# Patient Record
Sex: Female | Born: 1955 | Race: White | Hispanic: No
Health system: Western US, Academic
[De-identification: ages and names within clinical notes are randomized; demographics above are authoritative.]

## PROBLEM LIST (undated history)

## (undated) DIAGNOSIS — I10 Essential (primary) hypertension: Secondary | ICD-10-CM

## (undated) DIAGNOSIS — M629 Disorder of muscle, unspecified: Secondary | ICD-10-CM

## (undated) DIAGNOSIS — I4891 Unspecified atrial fibrillation: Secondary | ICD-10-CM

## (undated) DIAGNOSIS — M259 Joint disorder, unspecified: Secondary | ICD-10-CM

## (undated) HISTORY — DX: Joint disorder, unspecified: M25.9

## (undated) HISTORY — PX: ABDOMINOPLASTY: SHX5160

## (undated) HISTORY — PX: PR UNLISTED PROCEDURE NECK/THORAX: 21899

## (undated) HISTORY — PX: BACK SURGERY: SHX5067

## (undated) HISTORY — DX: Unspecified atrial fibrillation: I48.91

## (undated) HISTORY — PX: PR MAMMAPLASTY AUGMENTATION W/O PROSTHETIC IMPLANT: 19324

## (undated) HISTORY — PX: CARPAL TUNNEL RELEASE: SHX5017

## (undated) HISTORY — DX: Essential (primary) hypertension: I10

## (undated) HISTORY — DX: Disorder of muscle, unspecified: M62.9

## (undated) HISTORY — PX: PR UNLISTED PROCEDURE FOOT/TOES: 28899

## (undated) HISTORY — PX: PR UNLISTED PROCEDURE SPINE: 22899

---

## 2016-02-14 ENCOUNTER — Encounter (HOSPITAL_BASED_OUTPATIENT_CLINIC_OR_DEPARTMENT_OTHER): Payer: Self-pay

## 2016-02-14 NOTE — Telephone Encounter (Signed)
Entered in error

## 2016-03-03 ENCOUNTER — Encounter (HOSPITAL_BASED_OUTPATIENT_CLINIC_OR_DEPARTMENT_OTHER): Payer: Self-pay

## 2016-03-28 ENCOUNTER — Telehealth (HOSPITAL_BASED_OUTPATIENT_CLINIC_OR_DEPARTMENT_OTHER): Payer: Self-pay | Admitting: Physician Assistant

## 2016-03-28 NOTE — Telephone Encounter (Signed)
Pt returned Trinna PostAlex B phone call.  Inetta Fermoina 6/20

## 2016-04-06 NOTE — Telephone Encounter (Signed)
I talked to Ms. Early CharsDill and she would like to schedule for Right ream and run in January.            Hansel FeinsteinAlexander Bertelsen, PA-C  Teaching Associate  Dept of Orthopaedics and Sports Medicine  Shoulder and Elbow Team   [employer]

## 2016-04-14 NOTE — Telephone Encounter (Signed)
Left message asking which week in January.   Advised that I would like to talk to her at length to give her all the information she needs.    Sandie AnoMarian Forssen  [facility]

## 2018-07-26 ENCOUNTER — Encounter (HOSPITAL_BASED_OUTPATIENT_CLINIC_OR_DEPARTMENT_OTHER): Payer: Self-pay | Admitting: Orthopaedic Surgery

## 2018-08-07 ENCOUNTER — Encounter (HOSPITAL_BASED_OUTPATIENT_CLINIC_OR_DEPARTMENT_OTHER): Payer: Self-pay

## 2018-08-13 ENCOUNTER — Encounter (HOSPITAL_BASED_OUTPATIENT_CLINIC_OR_DEPARTMENT_OTHER): Payer: Self-pay

## 2018-08-15 ENCOUNTER — Encounter (HOSPITAL_BASED_OUTPATIENT_CLINIC_OR_DEPARTMENT_OTHER): Payer: Self-pay | Admitting: Orthopaedic Surgery

## 2018-08-21 ENCOUNTER — Telehealth (HOSPITAL_COMMUNITY): Payer: Self-pay

## 2018-08-21 NOTE — Telephone Encounter (Signed)
Updated med list per phone interview with patient for Med Consult [facility]

## 2018-08-25 DIAGNOSIS — Z9889 Other specified postprocedural states: Secondary | ICD-10-CM | POA: Insufficient documentation

## 2018-08-25 DIAGNOSIS — Z955 Presence of coronary angioplasty implant and graft: Secondary | ICD-10-CM | POA: Insufficient documentation

## 2018-08-25 DIAGNOSIS — I2510 Atherosclerotic heart disease of native coronary artery without angina pectoris: Secondary | ICD-10-CM | POA: Insufficient documentation

## 2018-08-25 DIAGNOSIS — I10 Essential (primary) hypertension: Secondary | ICD-10-CM | POA: Insufficient documentation

## 2018-08-25 DIAGNOSIS — Z8679 Personal history of other diseases of the circulatory system: Secondary | ICD-10-CM | POA: Insufficient documentation

## 2018-08-26 ENCOUNTER — Ambulatory Visit (HOSPITAL_BASED_OUTPATIENT_CLINIC_OR_DEPARTMENT_OTHER): Payer: BLUE CROSS/BLUE SHIELD | Admitting: Orthopaedic Surgery

## 2018-08-26 ENCOUNTER — Encounter (HOSPITAL_BASED_OUTPATIENT_CLINIC_OR_DEPARTMENT_OTHER): Payer: Self-pay | Admitting: Orthopaedic Surgery

## 2018-08-26 ENCOUNTER — Encounter (HOSPITAL_BASED_OUTPATIENT_CLINIC_OR_DEPARTMENT_OTHER): Payer: BLUE CROSS/BLUE SHIELD | Admitting: Orthopaedic Surgery

## 2018-08-26 ENCOUNTER — Encounter (HOSPITAL_BASED_OUTPATIENT_CLINIC_OR_DEPARTMENT_OTHER): Payer: Self-pay | Admitting: Unknown Physician Specialty

## 2018-08-26 ENCOUNTER — Ambulatory Visit (HOSPITAL_BASED_OUTPATIENT_CLINIC_OR_DEPARTMENT_OTHER): Payer: BLUE CROSS/BLUE SHIELD | Admitting: Unknown Physician Specialty

## 2018-08-26 ENCOUNTER — Ambulatory Visit (HOSPITAL_BASED_OUTPATIENT_CLINIC_OR_DEPARTMENT_OTHER): Payer: BLUE CROSS/BLUE SHIELD

## 2018-08-26 VITALS — BP 131/91 | HR 84 | Temp 98.4°F | Ht 61.0 in | Wt 133.5 lb

## 2018-08-26 DIAGNOSIS — Z955 Presence of coronary angioplasty implant and graft: Secondary | ICD-10-CM

## 2018-08-26 DIAGNOSIS — M25519 Pain in unspecified shoulder: Secondary | ICD-10-CM

## 2018-08-26 DIAGNOSIS — I10 Essential (primary) hypertension: Secondary | ICD-10-CM

## 2018-08-26 DIAGNOSIS — Z01818 Encounter for other preprocedural examination: Secondary | ICD-10-CM

## 2018-08-26 DIAGNOSIS — Z6825 Body mass index (BMI) 25.0-25.9, adult: Secondary | ICD-10-CM

## 2018-08-26 DIAGNOSIS — I2510 Atherosclerotic heart disease of native coronary artery without angina pectoris: Secondary | ICD-10-CM

## 2018-08-26 DIAGNOSIS — Z9889 Other specified postprocedural states: Secondary | ICD-10-CM

## 2018-08-26 DIAGNOSIS — M19011 Primary osteoarthritis, right shoulder: Secondary | ICD-10-CM

## 2018-08-26 DIAGNOSIS — Z8679 Personal history of other diseases of the circulatory system: Secondary | ICD-10-CM

## 2018-08-26 LAB — CBC, DIFF
% Basophils: 1 %
% Eosinophils: 2 %
% Immature Granulocytes: 0 %
% Lymphocytes: 31 %
% Monocytes: 14 %
% Neutrophils: 52 %
% Nucleated RBC: 0 %
Absolute Eosinophil Count: 0.08 10*3/uL (ref 0.00–0.50)
Absolute Lymphocyte Count: 1.67 10*3/uL (ref 1.00–4.80)
Basophils: 0.04 10*3/uL (ref 0.00–0.20)
Hematocrit: 40 % (ref 36–45)
Hemoglobin: 13.7 g/dL (ref 11.5–15.5)
Immature Granulocytes: 0.01 10*3/uL (ref 0.00–0.05)
MCH: 34.2 pg — ABNORMAL HIGH (ref 27.3–33.6)
MCHC: 34.3 g/dL (ref 32.2–36.5)
MCV: 100 fL — ABNORMAL HIGH (ref 81–98)
Monocytes: 0.76 10*3/uL (ref 0.00–0.80)
Neutrophils: 2.78 10*3/uL (ref 1.80–7.00)
Nucleated RBC: 0 10*3/uL
Platelet Count: 335 10*3/uL (ref 150–400)
RBC: 4.01 10*6/uL (ref 3.80–5.00)
RDW-CV: 11 % — ABNORMAL LOW (ref 11.6–14.4)
WBC: 5.34 10*3/uL (ref 4.3–10.0)

## 2018-08-26 LAB — COMPREHENSIVE METABOLIC PANEL
ALT (GPT): 25 U/L (ref 7–33)
AST (GOT): 34 U/L (ref 9–38)
Albumin: 4.7 g/dL (ref 3.5–5.2)
Alkaline Phosphatase (Total): 101 U/L (ref 31–132)
Anion Gap: 11 (ref 4–12)
Bilirubin (Total): 0.7 mg/dL (ref 0.2–1.3)
Calcium: 9.6 mg/dL (ref 8.9–10.2)
Carbon Dioxide, Total: 29 meq/L (ref 22–32)
Chloride: 98 meq/L (ref 98–108)
Creatinine: 0.65 mg/dL (ref 0.38–1.02)
GFR, Calc, African American: >60 mL/min/{1.73_m2} (ref >59)
GFR, Calc, European American: >60 mL/min/{1.73_m2} (ref >59)
Glucose: 95 mg/dL (ref 62–125)
Potassium: 4 meq/L (ref 3.6–5.2)
Protein (Total): 7 g/dL (ref 6.0–8.2)
Sodium: 138 meq/L (ref 135–145)
Urea Nitrogen: 12 mg/dL (ref 8–21)

## 2018-08-26 LAB — PROTHROMBIN & PTT
Partial Thromboplastin Time: 31 s (ref 22–35)
Prothrombin INR: 1 (ref 0.8–1.3)
Prothrombin Time Patient: 13.2 s (ref 10.7–15.6)

## 2018-08-26 NOTE — Progress Notes (Signed)
Preoperative instruction given per [facility] protocol. Literature for [employer], [employer] Directive, and post-op instructions given to pt.  Discussed DOS and sequence of care/events.  Stressed NPO status preop, shower X2 prior to surgery with antibacterial soap. Pt advised to stop/hold the following meds:  NSAIDs have been held for 1 week pre-op.  Call if further questions/concerns. [facility] have initial post-op there and RTC for 6 week post-op. Pt will have pain med refills through [place] in [place].

## 2018-08-26 NOTE — Progress Notes (Signed)
[facility] Medicine Consult Service Clinic  Outpatient New Clinic Note    DATE: 08/26/2018     Requesting Provider: Barbette Or III  Planned Procedure/Date: R ream and run arthroplasty 08/27/18  PCP: Adria Symonds [facility]    Chief Complaint: pre-op evaluation prior to R ream and run arthroplasty    SUBJECTIVE:       HPI:  Desiree Jones is a 62 year old female who is here today for preoperative risk stratification and medical recommendations in anticipation of procedure listed above.     Desiree Jones has been experiencing pain and decreased ROM in the R shoulder for more than 5 years. She initially made an appointment here 2 years ago to receive a ream and run shoulder surgery but had second thoughts and cancelled at the time. Since then she has had progressive loss of function in her R shoulder and has reached the point where she would like surgery. Her husband had the same surgery in 2016 and they liked the outcomes so want the same procedure for her. 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 She denied history of trauma to the shoulder but says she has had chronic wear and tear on her shoulder from working out and weight lifting over the past 40 years. She takes Celebrex for the shoulder pain but has been holding it in anticipation of the surgery. She was prescribed Norco 7.5 mg for her shoulder pain, of which she has only taken 2-3 times since it was prescribed in September.     PMHx:    #CAD   At the time of cardiac work-up for a-flutter (see below), she received a cardiac CT that showed coronary findings concerning for CAD. She underwent LHC which showed 90% stenosis of her LAD and received a DES. She was on DAPT for 6 months after that. She still takes aspirin 81 mg daily but has been holding it in advance of surgery tomorrow. She was trialed on a statin but was unable to tolerate it  due to musculoskeletal pain so the statin was taken off and her lipids have been monitored. She was previously taking metoprolol for her a-fib but stopped it after her ablation. She is not currently taking a beta-blocker or an ACE inhibitor/ARB. She has never had chest pain or chest pressure. She denies PND, orthopnea, and peripheral edema.    #Afib/Aflutter  She was diagnosed with a-flutter in 2016, diagnosis confirmed with an EP study. At the time she was having episodes that would last 1 day long every week. She underwent cardiac mapping and cryoablation that year. Six months later she had recurring palpitations and underwent second cryoablation in 2017. Now she continues to have paroxysmal a-fib, 1 episodes every other month or so, lasts about a couple hours. These episodes are most commonly triggered by dehydration. Prior to the ablations she had a couple episodes of RVR and was briefly on dofetilide, but has not been in RVR since the procedures. She has not had any event monitoring since her second ablation. She is not currently on any anticoagulation. CHADS2VASC 3, stroke risk 4.6%.     #HTN  She initially was diagnosed with hypertension when undergoing cardiac clearance for lumbar spine surgery in 2014. She was started on amlodipine 10 mg then and has since been downtitrated to 5 mg daily for hypertension. BP runs 115-130/60-80  mm Hg at home. She denies any prior history of hypertensive urgency/emergency. She denies vision changes or headaches related to hypertension. She denies diagnosis of chronic kidney disease.     #Atopic Dermatitis  She has had eczema for >5 years, for which she takes dupilumab injections and fluocinonide cream. She was tested for TB and hepatitis serologies prior to starting dupilumab.     #Chronic Back Pain  She has had cervical radiculopathy which was treated with posterior cervical spinal fusion C4-C6. She had a lumbar fusion of L4-L5 in 2014 for spondylosthesis. She did her own PT  for her back pain.     PSHx:  LEEP and concomitant BTL 1985  Breast augmentation 2001  Cervical fusion 2004  Bilateral carpal tunnel surgery 2007  Lumbar fusion 2014  OD cataract surgery 2014  LEEP and cervical conization 2016  Afib ablation 2016, 2017  Coronary artery stent placement 2017    Surgical Complications:  Anesthesia complications: she denies any prior issues with tolerating anesthesia, intra-operative hypotension, post-operative bleeding, post-operative DVTs, post-anesthesia nausea, or post-operative delirium.    Medications:  Amlodipine 5 mg every day at night.  Aspirin 81 mg every day at night.  Celebrex 200 mg every day after breakfast.  Dupixent 300 mg injection every other Sunday. She last injected 11/10.  Fluocinonide cream 0.05% every day to areas of rash.    Allergies:  Latex sensitivity    Family Hx:  She doesn't know her family history because she was adopted.    Social Hx:  Tobacco: Never smoker.  ETOH: 4-6 shots of liquor a week.  Recreational drugs: denies    The patient lives in [place], [place] in a house. She and her husband recently moved from [place], [place].  The patient is not currently working. 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    Functional Capacity / Exercise Tolerance:  Able to walk more than 5 city blocks on flat surface.   Able to climb more than 5 flights of stairs without stopping.   Exercise tolerance is not limited by dyspnea or chest pain. She has created an exercise routine around her shoulder pain so MSK pain does not limit her activity level.    OSA Screen:  STOP-BANG Questionnaire for Obstructive Sleep Apnea  Assign 1-point for "YES" answers    Do you snore loudly? No  Do you often feel tired, fatigued, or sleepy during the daytime? No  Has anyone observed you stop breathing during sleep? No  Do you have (or are you being treated) for high blood pressure? Yes    BMI >35 kg/m^2 ?  No  Age >50? Yes  Neck circumference >40cm? No  Female gender? No    TOTAL: 2  Risk for OSA (High >= 3): intermediate      ROS:  The ROS other than noted above in "History of Present Illness" or "Problem List", or other than as noted here or on the aforementioned scanned form, was negative in all other systems.     Constitutional: negative  HEENT: negative  CV: negative  PULM: negative  GI: negative  GU: negative  Hem: negative  Endocrine: negative  Neuro: negative  Psych: negative  Skin: negative    OBJECTIVE:       Physical Exam:  BP (!) 131/91   Pulse 84    Temp 98.4 F (36.9 C) (Temporal)  Ht 5\' 1"  (1.549 m)    Wt 133 lb 8 oz (60.6 kg)    SpO2 99%    BMI 25.22 kg/m   GEN: NAD, well-appearing, appears stated age  HEENT: No conjunctival injection or pallor. Moist oral mucosa, no erythema or exudates  CV: RRR. S1, S2. No S3, S4. No murmur.    PULM: Breathing comfortably on room air. Lungs clear to auscultation bilaterally. No w/r/c.   ABD: Pos BS. Soft, NT, ND.    MSK: No gross deformity   NEURO: CN 2-12 intact. AAO x4.   DERM: No rash. Chronic solar dermatitis.   PSYCH: Appropriate affect. Alert, oriented x4     Pertinent Studies:   EKG obtained in clinic and reviewed by Dr. Daria PasturesSalud and myself. Normal sinus rhythm. T wave inversions in V1-V3 of unknown chronicity. No ST changes, no LVH.    Recent pertinent labs/images [place] by [place] [facility] [place].      ASSESSMENT & PLAN:     Desiree Jones is a 62 year old female presented today for preoperative risk stratification and medical recommendations in anticipation of above surgery.    Recommendations:  I. Cardiopulmonary Risk Assessment  Cardiac risk:   RCRI = 1  The patient's predicted risk of perioperative cardiac events using the Revised Cardiac Risk Index is approximately 6%.       Chales AbrahamsGupta = 0.1%  Newer, partly validated prediction tools (see Nolon NationsGupta et al, from Circulation. 2011 jul 26; 124(4):381-7) based upon ASA class, functional status, age, Cr, and procedure  type suggests 0.14% risk of major cardiac events.    The patient is able to achieve more than 4 METS of activity on regular basis without symptoms suggestive of cardiac ischemia.     Based upon this assessment, and on perioperative risk stratification guidelines from AHA/ACC, we would recommend proceeding with the planned surgery without additional cardiac work-up beyond the EKG obtained in clinic today. Though she has a significant history of coronary artery disease her EKG is reassuring and she has had no symptoms since her stent was placed 2 years ago. Regarding her atrial fibrillation, she has paroxysmal a-fib with relatively low frequency of arrhythmia (albeit without formal event monitoring). Her husband does note that dehydration is a significant trigger for converting to a-fib and we would recommend maintaining euvolemia with fluid resuscitation to prevent this.   There are scanned records in Epic (see Media tab) from [place] in [place] that recommend she proceed w/ surgery without further testing.   Of note, she is not taking typical secondary prevention medications for her coronary artery disease, including a statin, beta-blocker, or ACE inhibitor. While this is not a barrier to receiving surgery tomorrow, we did encourage the patient to discuss this with her primary care physician and cardiologist back in [place].     Pulmonary risk:   ARISCAT = 3  STOP-BANG = 2  The patient's predicted risk of perioperative pulmonary events is approximately 1.6%.    Based upon this assessment, we would recommend regular perioperative procedures. We do not anticipate difficult intubation or extubation. Though her STOP-BANG is technically "intermediate risk," she scores for non-modifiable risk factors and does not have any underlying pulmonary disease that confer greater pulmonary risk.     Delirium risk:   average based on history, risk factors, and medications.      II. Problem List:  1. History of CAD  2. Status post  coronary artery stent  3. Status post ablation  of atrial fibrillation  4. HTN  5. Atopic dermatitis  6. Chronic back pain    III. Specific Perioperative Considerations:  1. HOLD 7 days prior to surgery: celebrex and aspirin  2. Continue perioperatively: amlodipine  3. She may resume dupilumab injections per her normal schedule on 09/01/18 (when her next injection is due)  4. While the patient is admitted overnight please maintain her on telemetry to capture any rhythm conversion to a-fib and RVR.   5. Maintain euvolemia with adequate fluid resuscitation. The patient has no history of heart failure related to CAD and should have no contraindications to fluid resuscitation. Euvolemia is important in this patient prevent provoking a-fib due to dehydration.  6. Upon discharge encourage patient to follow up with her PCP about secondary prevention medications for CAD including beta-blocker, statin, and ACE inhibitor/ARB.    IV. General Perioperative Considerations:   Early mobilization as tolerated   Aggressive pulmonary toilet including incentive spirometry multiple times per hour while awake.    DVT/VTE prophylaxis per surgical team preference.    Treat postoperative pain as appropriate.    Minimize medications with psychoactive effects, such as benzodiazepines (e.g. Ativan), anti-dopaminergics (e.g. Reglan), anti-cholinergics (e.g. Benadryl).     Other delirium precautions include: re-orient frequently as needed; maintain day/night cycle including minimizing naps if possible; have family/friends at bedside as much as possible; mobilize early; keep sensory aids such as glasses or hearing aids nearby; avoid volume depletion and eletrolyte abnormalities, etc.     We thank Dr. Barbette Or III for this referral to our medicine consultation clinic. Please contact Dr. Daria Pastures with questions by [facility] email, EPIC inbox, or pager [phone].     To the inpatient surgical team: We will follow this patient post operatively. Please feel  free to contact us during admission through [facility] in [place].    Reece Agar, MD, MPH  Internal Medicine R1    ATTENDING STATEMENT  I saw and evaluated the patient and agree with Dr.Ganguly's note. I reviewed the note and edited where appropriate.    Date of Service:  08/26/18  Today's Date is: 08/27/18    Jonne Ply, MD MPH  Clinical Instructor, Division of General Internal Medicine

## 2018-08-26 NOTE — Progress Notes (Signed)
[facility] & [facility] Medicine  Shoulder And Elbow Service       [facility]; [address] [place] ; [place], [place]  [id number]  [phone]; [phone]    [email or web address]    Primary Care Provider:  Outside PCP  Identifies Patients Who Have A Non [facility] Pcp    Referring Provider:  No ref. provider found              Patient Care Team:  No providers found        Subjective History  We had the pleasure of seeing Ms. Desiree Jones in our Shoulder and Elbow Clinic at the [facility] for a clinical visit.  She is a most delightful 62 year old female with right shoulder arthritis.    Desiree Jones is know to us through her husband who did very well after a right shoulder ream and run.  Desiree Jones has advanced degenerative joint disease and would like to proceed with a right shoulder ream and run.        Related Information   Chief Complaint   Patient presents with    New Patient     right shoulder pain and decreased funtion and ROM   Handed: right handed     Work Related Problem: No    Is a lawyer involved with this problem: No     History of Present Illness  1. Location - where is the problem located? Right Shoulder    2. Severity - Intensity of Pain/discomfort: (1 = No Pain, 10 = Severe Pain): 6     3. Context - How did this problem begin? Refer to subjective note above.     4. Modifying Factors -  What makes symptom(s) worse? Using affected side    What improves your symptom(s)? Rest  Ice  Heat  NSAIDs       Review of Systems  Constitutional: negative for weight gain, weight loss, fatigue, insomnia, fever and night-sweats/chills   Eyes: glasses/contacts and cataracts    Ear/Nose/Throat: negative for sinus trouble, hearing loss and ringing in ears   Cardiovascular: high blood pressure and irregular heartbeat   Respiratory: negative for shortness of breath, difficulty breathing, lung disease and  persistent cough   Gastrointestinal: negative for decreased appetite, constipation, heartburn, nausea, diarrhea and hepatitis   Musculoskeletal:  arthritis   Genitourinary: negative for kidney stone, bladder/kidney infections, prostate problems and painful urinating   Skin/Integument:  dermatitis   Neurological:  negative for seizures, tingling, numbness and severe headaches   Psychiatric:  negative for anxiety, depression or other mental health issues   Endocrine:  negative for increased thirst, diabetes and thyroid disorders   Blood/Lymphatic: negative for bleeding or clotting problems, anemia and swollen or enlarged lymph nodes   Immunological: negative for HIV/AIDS, Sjgren's syndrome, scleroderma, hay fever and lupus   Cancer: negative for cancer        Other History  Her health history includes:     Social History     Occupational History    Not on file   Tobacco Use    Smoking status: Never Smoker   Substance and Sexual Activity    Alcohol use: Yes     Alcohol/week: 1.0 standard drinks     Types: 1 Shots of liquor per week    Drug use: Not on file    Sexual activity: Not on file   Marital  Status: Married   Number of adults living with Desiree Jones: 1        Past Medical History:   Diagnosis Date    Anesthesia complication     Disorder of muscle, ligament, and fascia     Hypertension     Shoulder problem      Past Surgical History:   Procedure Laterality Date    BACK SURGERY      CARPAL TUNNEL RELEASE      UNLISTED PROCEDURE FOOT/TOES      UNLISTED PROCEDURE NECK/THORAX      UNLISTED PROCEDURE SPINE     Review of patient's allergies indicates:  Allergies   Allergen Reactions    Latex Rash     Itchy rash     Current Outpatient Medications   Medication Sig Dispense Refill    amLODIPine 5 MG tablet Take 5 mg by mouth daily.      aspirin 81 MG tablet Take 81 mg by mouth daily.      celecoxib 200 MG capsule Take 200 mg by mouth daily.      dupilumab (DUPIXENT) 300 MG/2ML prefilled syringe Inject 300  mg under the skin. Every other week      fluocinonide 0.05 % cream 1 application. as needed      HYDROcodone-acetaminophen (NORCO) 7.5-325 MG tablet Take 1 tablet by mouth. as needed      ketoconazole 2 % shampoo 1 application. as needed       No current facility-administered medications for this visit.      Family History     *Patient is Adopted           Physical Examination  Please note that a for the findings below:       "-" signifies a Negative finding       "+" signifies a Positive finding       a blank denotes test was not performed or documented    Constitutional:  General appearance:  Desiree Jones is a well msucular, well nourished female in no apparent distress.   BP (!) 131/91 Comment: pt has list from cardiology, BP always high at offices but fine at home, takes dail   Pulse 84    Temp 98.4 F (36.9 C) (Temporal)    Ht 5\' 1"  (1.549 m)    Wt 133 lb 8 oz (60.6 kg)    SpO2 99%    BMI 25.22 kg/m     Psychological:  Her judgment, insight, memory, mood and affect appear to be within normal limits    Neurological:  She is alert and oriented without any obvious gross neurological deficits    Respiratory:  She is without any obvious respiratory distress    Upper Extremities:   Cardiovascular Inspection: Right    Edema Negative         Respiratory: Right    Cyanosis Negative         Hematologic: Right    Ecchymosis Negative         Skin Inspection: Right    Rashes  Negative    Lesions Negative    Ulcers Negative    Erythema Negative    Signs of infection Negative         Musculoskeletal: Right    Active forward elevation of the right arm with limited range of motion        Diagnostic Tests and Studies   X-ray Studies:  X-rays of the shoulder do not demonstrate any grossly obvious  fractures or dislocations.    The glenohumeral joint space appears to be decreased.    There is osteophyte formation noted in the proximity of the humeral head.    Other Imaging Studies:  Other imaging studies were not obtained or  available for review today.    Laboratory:  None       Assessment  and Plan   In summary, Desiree Jones is a 62 year old female with right shoulder discomfort and dysfunction, which appears to be caused by severe degenerative joint disease.     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  She is a candidate for a ream and run.  Because some of our patients wish to avoid the possible risk wear or loosening of the artificial socket (glenoid component), we have developed a procedure we refer to as the ream and run operation. In this surgery, the incision, surgical approach, and management of the ball side of the joint (the humeral articular surface) are exactly the same as for a total shoulder. However, the management of the socket side (glenoid) is different in that the bone is reamed to a concave shape designed to restore stability and load transfer, but a plastic socket is not inserted. Such a procedure may be desirable especially for individuals who are young or those who plan heavy use of the shoulder, particularly if that use involves repeated impact.     This is not a procedure for individuals who are smokers, who have complex shoulder conditions, who have pain requiring narcotic medication before surgery, or who are unable to commit to the time and effort for the aftercare following the procedure.     In the ream and run procedure the bony socket is reamed and not resurfaced, so the body has to heal over the surface with a layer of fibrous/cartilaginous tissue. During this healing time it is absolutely essential that the range of motion be maintained at the level achieved in the medical center,that is over 150 degrees of elevation (this is equivalent to getting the arm up to the level of the ear).  This requires special commitment on the part of the patient to make sure that stiffness does not creep into the shoulder. The required range of motion is maintained by gentle  stretching exercises that are performed five times a day for the first three months.     With a dedicated rehabilitation effort on the part of the patient the healing process is usually complete within six months, but in some cases it may take longer before the shoulder becomes comfortable.  Many of our patients have achieved wonderful results after this procedure and have regained superb range of motion, comfort, strength and function. Some of these individuals have offered to be resources to those considering this procedure or who are in the earlier stages of their rehabilitation.  In some cases, however, the patient is unable to maintain the range of motion or the shoulder never becomes comfortable. In these situations, consideration can be give to a revision shoulder surgery, either to release adhesions and scar tissue that may have formed, or sometimes to convert the ream and run to a total shoulder with an artificial glenoid socket.  In situations were patients become stiff during the recovery phase shortly after surgery, we can often manipulate their stiff shoulders while the patient is under anesthesia with good results.  More information on the Ream and Run can be found at [email or web address]  Desiree Jones would like to proceed with surgery.    Please note that Desiree Jones was seen by Dr. Karmen Bongo III who saw and examined this patient and agrees with this plan.           Wesley Blas, PA-C  [facility]   [facility] [place]  Shoulder and Elbow Team            Preop Note    We had the most wonderful pleasure of seeing Desiree Jones in our Shoulder and Elbow Clinic at the [facility] for a preoperative visit.      The postoperative Support Plan calls assistance from:  Telephone Information:   Home Phone [phone]   Work Phone Not on file.   Mobile [phone]        Accompany home after surgery: Desiree Jones, Desiree Jones Legal Next of Kin [phone]       Contact Number:    Relationship:        Assisting at home after surgery: same   Contact Number:    Relationship:               Health History and Physical Examination    Please refer to the Medicine Consult note for Desiree Jones detailed medical history, physical exam and medical recommendations.        Assessment and Plan   Desiree Jones is here for a pre-procedure evaluation for a right "Ream and Run" shoulder hemiarthroplasty with non-prosthetic glenoid arthroplasty.  She was explained the risks of proceeding with this procedure.  These risks include:  1. Infection   2. Ongoing pain   3. Ongoing bleeding  4. Stiffness   5. The risk that this procedure has no benefit  6. The risk of future surgeries  7. Peri-procedure fractures  8. Nerve damage  9. Blood clots  10. Chipping of teeth  11. End organ damage  12. Allergic reactions  13. Death or coma  14. Infectious disease transmission risk of receiving blood or blood products      She understands the risks and potential benefits, proposed procedure and is ready for this procedure.    Please note that Desiree Jones will undergo a Medicine Consult prior to her procedure.       Questions and/or concerns should be directed to either myself or our attending surgeon Reesa Chew, MD.               Wesley Blas, PA-C  Orthopaedics and Sports Medicine  [facility]  Shoulder and Elbow Team

## 2018-08-26 NOTE — Patient Instructions (Signed)
[facility]  [facility], 3rd floor  [phone]  [facility]  [address]   [place], [place] [id number]    Phone [phone]  Fax [phone]    We appreciate you spending time at our clinic today so that we could thoroughly learn your medical history and provide recommendations to minimize the risk of complications during your hospital stay. Please review this document closely as a reminder of what was discussed at today's clinic visit.     You saw the following physician at your visit today:    Everett GraffJonathan Salud, MD MPH    [facility] MED[facility] CON[facility]ULT VISIT  Your medical history and how it affects your surgery was discussed with you today. Please note that we are consultants and that your surgeon remains your primary provider for this surgery. If you have questions about whether you will be scheduled for surgery or when your surgery will be, please contact your surgeon. If you have significant changes to your health, or new medications have been added to your current regimen between now and the time of surgery, or you have questions about what we discussed today including the medication recommendations below, you may contact our clinic or your surgeon. The best way to contact us is by the phone or fax number listed above. It will typically take at least one business day to respond.     Do not contact us through e[facility] for urgent matters as we may not immediately receive your message.    PERIOPERATIVE MEDICATION AND CARE RECOMMENDATIONS    Additional pre-operative testing: EKG completed in clinic today    Careful medication management before and after surgery can help reduce the risk of complications. We recommend the following:    Before the day of surgery:  ANTIPLATELET DRUGS  - No nonsteroidal anti-inflammatory medications (NSAIDs) such as ibuprofen (Advil, Motrin), naproxen (Aleve), diclofenac, ketorolac (Toradol) 3-5 days prior to surgery.   - Stop aspirin 7 days  prior to surgery.    On the morning of surgery:  - Take ONLY the following medications (no others) with sips of water: amlodipine 5 mg, fluocinonide cream as needed

## 2018-08-27 ENCOUNTER — Inpatient Hospital Stay: Payer: BLUE CROSS/BLUE SHIELD | Admitting: Rehabilitative and Restorative Service Providers"

## 2018-08-27 ENCOUNTER — Inpatient Hospital Stay (HOSPITAL_COMMUNITY): Payer: BLUE CROSS/BLUE SHIELD | Admitting: Orthopaedic Surgery

## 2018-08-27 ENCOUNTER — Other Ambulatory Visit: Payer: Self-pay | Admitting: Student in an Organized Health Care Education/Training Program

## 2018-08-27 ENCOUNTER — Ambulatory Visit (HOSPITAL_BASED_OUTPATIENT_CLINIC_OR_DEPARTMENT_OTHER): Payer: BLUE CROSS/BLUE SHIELD

## 2018-08-27 ENCOUNTER — Inpatient Hospital Stay
Admission: RE | Admit: 2018-08-27 | Discharge: 2018-08-29 | DRG: 483 | Disposition: A | Discharge status: Home / Self Care | Payer: BLUE CROSS/BLUE SHIELD | Attending: Orthopaedic Surgery | Admitting: Orthopaedic Surgery

## 2018-08-27 DIAGNOSIS — M19211 Secondary osteoarthritis, right shoulder: Secondary | ICD-10-CM

## 2018-08-27 DIAGNOSIS — I10 Essential (primary) hypertension: Secondary | ICD-10-CM | POA: Diagnosis present

## 2018-08-27 DIAGNOSIS — Z7982 Long term (current) use of aspirin: Secondary | ICD-10-CM

## 2018-08-27 DIAGNOSIS — I4892 Unspecified atrial flutter: Secondary | ICD-10-CM | POA: Diagnosis present

## 2018-08-27 DIAGNOSIS — Z96611 Presence of right artificial shoulder joint: Secondary | ICD-10-CM

## 2018-08-27 DIAGNOSIS — L309 Dermatitis, unspecified: Secondary | ICD-10-CM | POA: Diagnosis present

## 2018-08-27 DIAGNOSIS — I480 Paroxysmal atrial fibrillation: Secondary | ICD-10-CM | POA: Diagnosis present

## 2018-08-27 DIAGNOSIS — Z8679 Personal history of other diseases of the circulatory system: Secondary | ICD-10-CM

## 2018-08-27 DIAGNOSIS — M19011 Primary osteoarthritis, right shoulder: Principal | ICD-10-CM | POA: Diagnosis present

## 2018-08-27 DIAGNOSIS — I2510 Atherosclerotic heart disease of native coronary artery without angina pectoris: Secondary | ICD-10-CM

## 2018-08-27 DIAGNOSIS — Z955 Presence of coronary angioplasty implant and graft: Secondary | ICD-10-CM

## 2018-08-27 DIAGNOSIS — I4891 Unspecified atrial fibrillation: Secondary | ICD-10-CM

## 2018-08-27 DIAGNOSIS — Z4731 Aftercare following explantation of shoulder joint prosthesis: Secondary | ICD-10-CM

## 2018-08-27 DIAGNOSIS — Z981 Arthrodesis status: Secondary | ICD-10-CM

## 2018-08-27 LAB — EKG 12 LEAD
Atrial Rate: 94 {beats}/min
P Axis: 53 degrees
P-R Interval: 172 ms
Q-T Interval: 366 ms
QRS Duration: 84 ms
QTC Calculation: 457 ms
R Axis: 39 degrees
T Axis: 71 degrees
Ventricular Rate: 94 {beats}/min

## 2018-08-27 LAB — GLUCOSE POC, ~~LOC~~
Glucose (POC): 81 mg/dL (ref 62–125)
Glucose (POC): 89 mg/dL (ref 62–125)

## 2018-08-28 ENCOUNTER — Inpatient Hospital Stay (HOSPITAL_BASED_OUTPATIENT_CLINIC_OR_DEPARTMENT_OTHER): Payer: BLUE CROSS/BLUE SHIELD | Admitting: Rehabilitative and Restorative Service Providers"

## 2018-08-28 DIAGNOSIS — L309 Dermatitis, unspecified: Secondary | ICD-10-CM

## 2018-08-28 LAB — MAGNESIUM
Magnesium: 1.5 mg/dL — ABNORMAL LOW (ref 1.8–2.4)
Magnesium: 1.8 mg/dL (ref 1.8–2.4)

## 2018-08-28 LAB — CBC (HEMOGRAM)
Hematocrit: 32 % — ABNORMAL LOW (ref 36–45)
Hemoglobin: 10.9 g/dL — ABNORMAL LOW (ref 11.5–15.5)
MCH: 34.5 pg — ABNORMAL HIGH (ref 27.3–33.6)
MCHC: 34.5 g/dL (ref 32.2–36.5)
MCV: 100 fL — ABNORMAL HIGH (ref 81–98)
Platelet Count: 297 10*3/uL (ref 150–400)
RBC: 3.16 10*6/uL — ABNORMAL LOW (ref 3.80–5.00)
RDW-CV: 11.1 % — ABNORMAL LOW (ref 11.6–14.4)
WBC: 8.27 10*3/uL (ref 4.3–10.0)

## 2018-08-28 LAB — BASIC METABOLIC PANEL
Anion Gap: 9 (ref 4–12)
Anion Gap: 8 (ref 4–12)
Calcium: 8.5 mg/dL — ABNORMAL LOW (ref 8.9–10.2)
Calcium: 8.6 mg/dL — ABNORMAL LOW (ref 8.9–10.2)
Carbon Dioxide, Total: 26 meq/L (ref 22–32)
Carbon Dioxide, Total: 24 meq/L (ref 22–32)
Chloride: 100 meq/L (ref 98–108)
Chloride: 102 meq/L (ref 98–108)
Creatinine: 0.65 mg/dL (ref 0.38–1.02)
Creatinine: 0.68 mg/dL (ref 0.38–1.02)
GFR, Calc, African American: >60 mL/min/{1.73_m2} (ref >59)
GFR, Calc, African American: >60 mL/min/{1.73_m2} (ref >59)
GFR, Calc, European American: >60 mL/min/{1.73_m2} (ref >59)
GFR, Calc, European American: >60 mL/min/{1.73_m2} (ref >59)
Glucose: 97 mg/dL (ref 62–125)
Glucose: 125 mg/dL (ref 62–125)
Potassium: 3.5 meq/L — ABNORMAL LOW (ref 3.6–5.2)
Potassium: 3.5 meq/L — ABNORMAL LOW (ref 3.6–5.2)
Sodium: 135 meq/L (ref 135–145)
Sodium: 134 meq/L — ABNORMAL LOW (ref 135–145)
Urea Nitrogen: 11 mg/dL (ref 8–21)
Urea Nitrogen: 12 mg/dL (ref 8–21)

## 2018-08-28 LAB — LAB ADD ON ORDER

## 2018-08-29 ENCOUNTER — Inpatient Hospital Stay (HOSPITAL_BASED_OUTPATIENT_CLINIC_OR_DEPARTMENT_OTHER): Payer: BLUE CROSS/BLUE SHIELD | Admitting: Rehabilitative and Restorative Service Providers"

## 2018-08-29 LAB — CBC (HEMOGRAM)
Hematocrit: 30 % — ABNORMAL LOW (ref 36–45)
Hemoglobin: 10 g/dL — ABNORMAL LOW (ref 11.5–15.5)
MCH: 33.7 pg — ABNORMAL HIGH (ref 27.3–33.6)
MCHC: 33.2 g/dL (ref 32.2–36.5)
MCV: 101 fL — ABNORMAL HIGH (ref 81–98)
Platelet Count: 275 10*3/uL (ref 150–400)
RBC: 2.97 10*6/uL — ABNORMAL LOW (ref 3.80–5.00)
RDW-CV: 11.4 % — ABNORMAL LOW (ref 11.6–14.4)
WBC: 8.13 10*3/uL (ref 4.3–10.0)

## 2018-08-29 LAB — BASIC METABOLIC PANEL
Anion Gap: 6 (ref 4–12)
Calcium: 8.5 mg/dL — ABNORMAL LOW (ref 8.9–10.2)
Carbon Dioxide, Total: 27 meq/L (ref 22–32)
Chloride: 102 meq/L (ref 98–108)
Creatinine: 0.68 mg/dL (ref 0.38–1.02)
GFR, Calc, African American: >60 mL/min/{1.73_m2} (ref >59)
GFR, Calc, European American: >60 mL/min/{1.73_m2} (ref >59)
Glucose: 98 mg/dL (ref 62–125)
Potassium: 3.8 meq/L (ref 3.6–5.2)
Sodium: 135 meq/L (ref 135–145)
Urea Nitrogen: 9 mg/dL (ref 8–21)

## 2018-09-16 LAB — WOUND C&S W/GRAM, ORTHO: Gram Smear: NONE SEEN

## 2018-09-17 LAB — WOUND C&S W/GRAM, ORTHO: Gram Smear: NONE SEEN

## 2018-09-19 LAB — WOUND C&S W/GRAM, ORTHO: Gram Smear: NONE SEEN

## 2018-10-21 ENCOUNTER — Encounter (HOSPITAL_BASED_OUTPATIENT_CLINIC_OR_DEPARTMENT_OTHER): Payer: BLUE CROSS/BLUE SHIELD | Admitting: Orthopaedic Surgery

## 2020-05-07 IMAGING — CR HIP RT 2 VW
1 series · 2 of 2 positions shown · non-contrast
Comparison: None

Right hip radiographs, 2 views
INDICATION: Right hip pain

[Series 1: ap · 0.17mm/px · 2 of 2 slices shown]
[im 1/2]
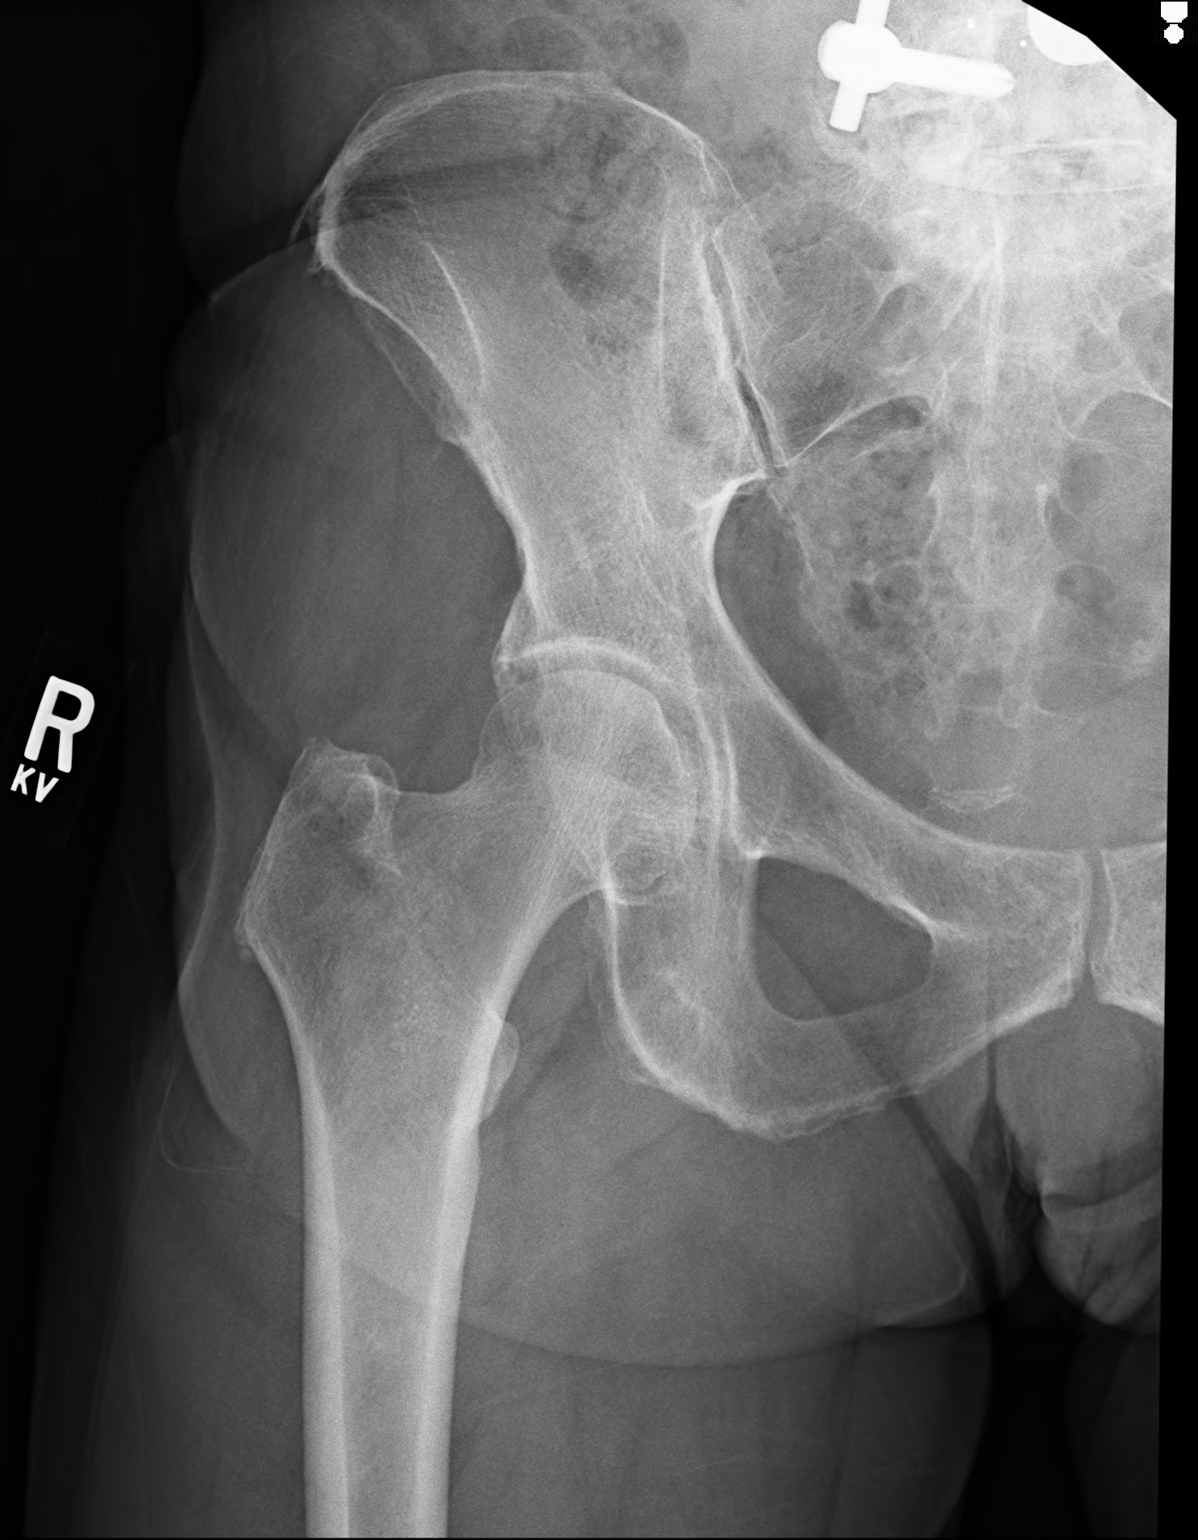
[im 2/2]
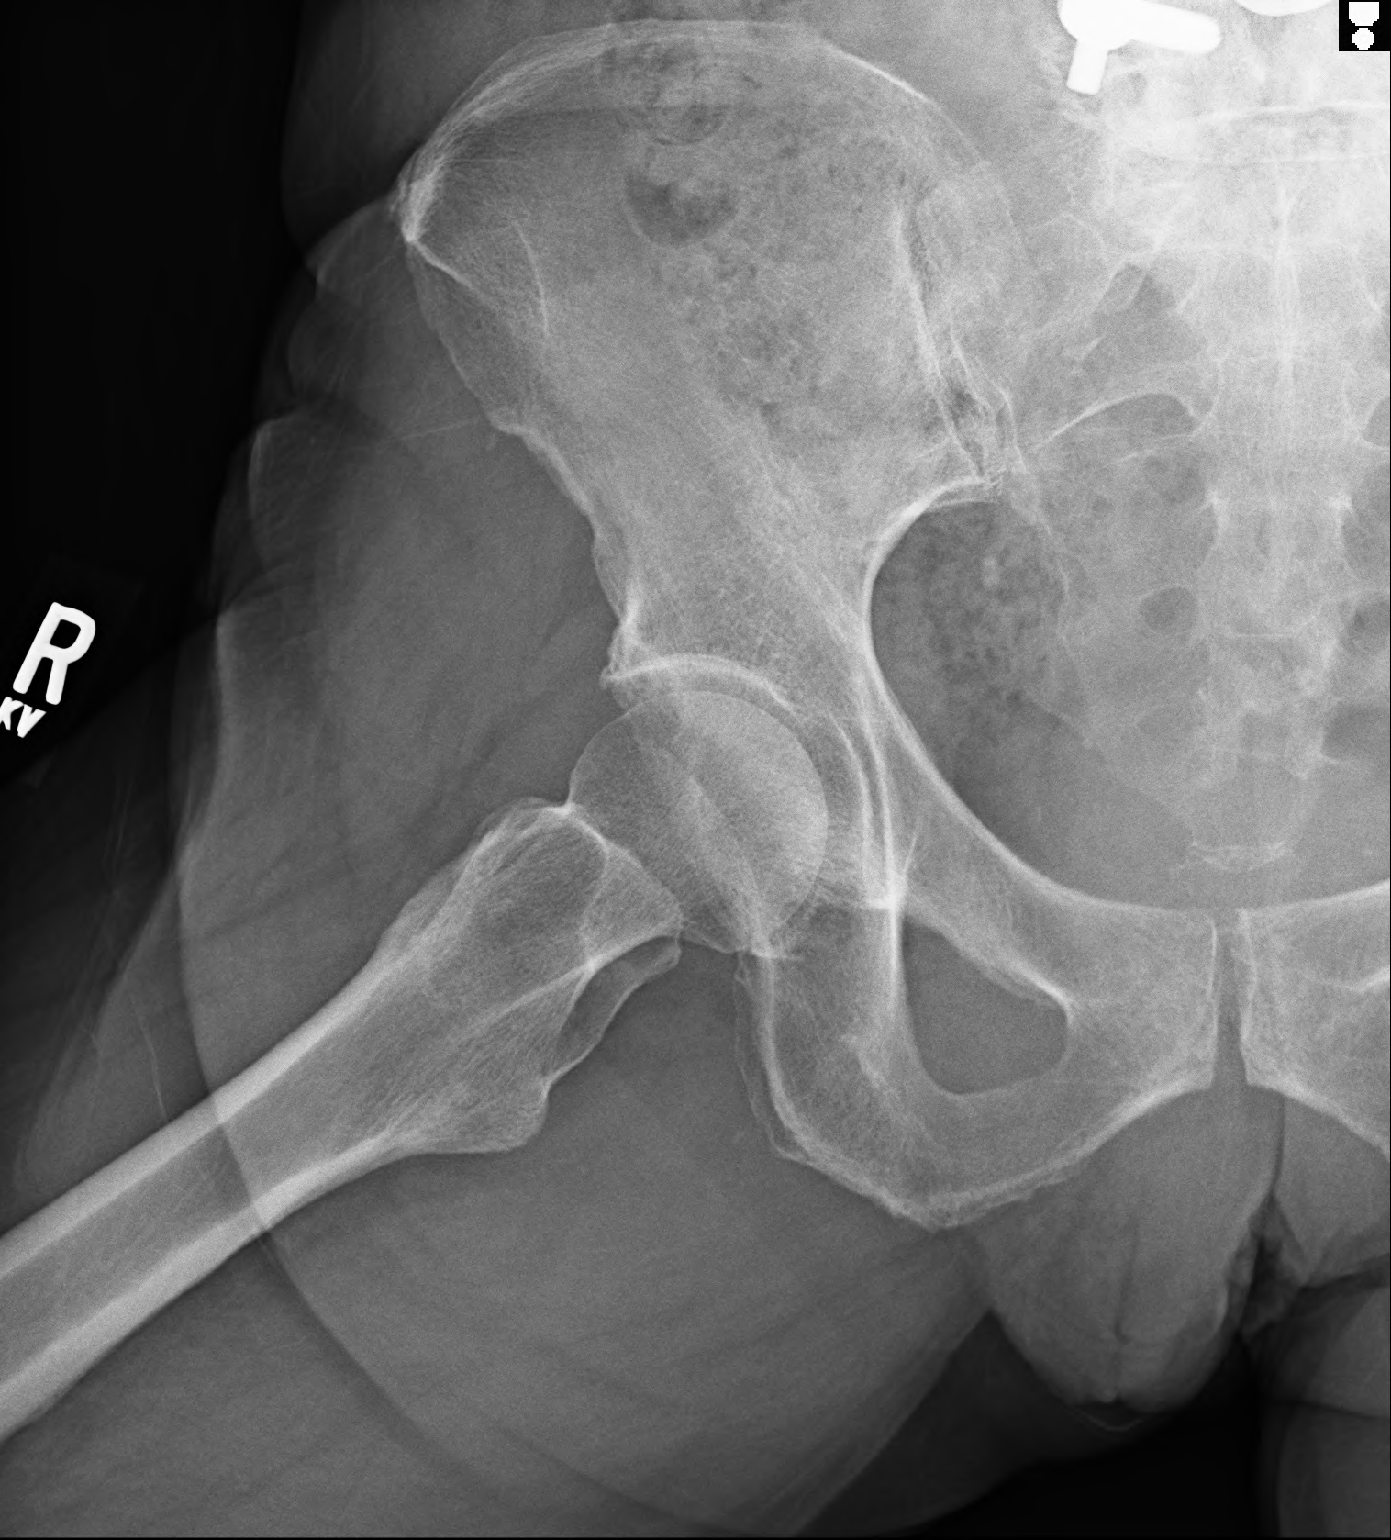

[2 of 2 positions shown; findings below may reference images not displayed]

FINDINGS: No acute fracture or dislocation. Joint spaces are intact. Fusion hardware noted in the lower lumbar spine.
IMPRESSION: Normal right hip radiographs.

## 2020-11-15 ENCOUNTER — Other Ambulatory Visit (HOSPITAL_COMMUNITY): Payer: Self-pay

## 2020-11-15 DIAGNOSIS — R69 Illness, unspecified: Secondary | ICD-10-CM

## 2020-11-15 IMAGING — CR SHOULDER RT 2-3 VWS
1 series · 4 of 4 positions shown · non-contrast
Comparison: None

Right shoulder 4 views

INDICATIONS:  Right shoulder pain

[Series 1: external rotate · 0.17mm/px · 4 of 4 slices shown]
[im 1/4]
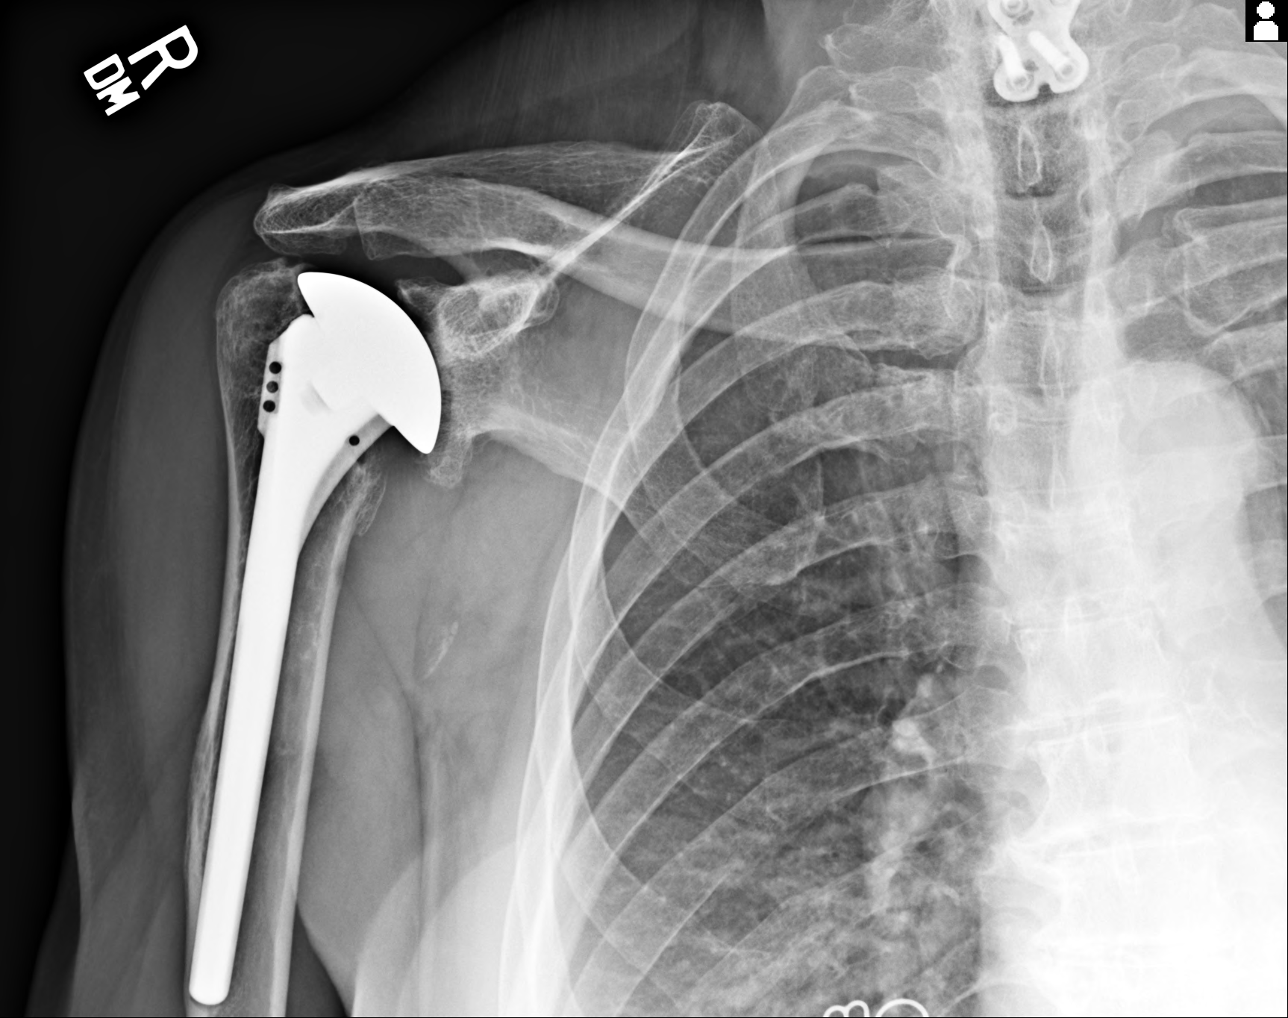
[im 2/4]
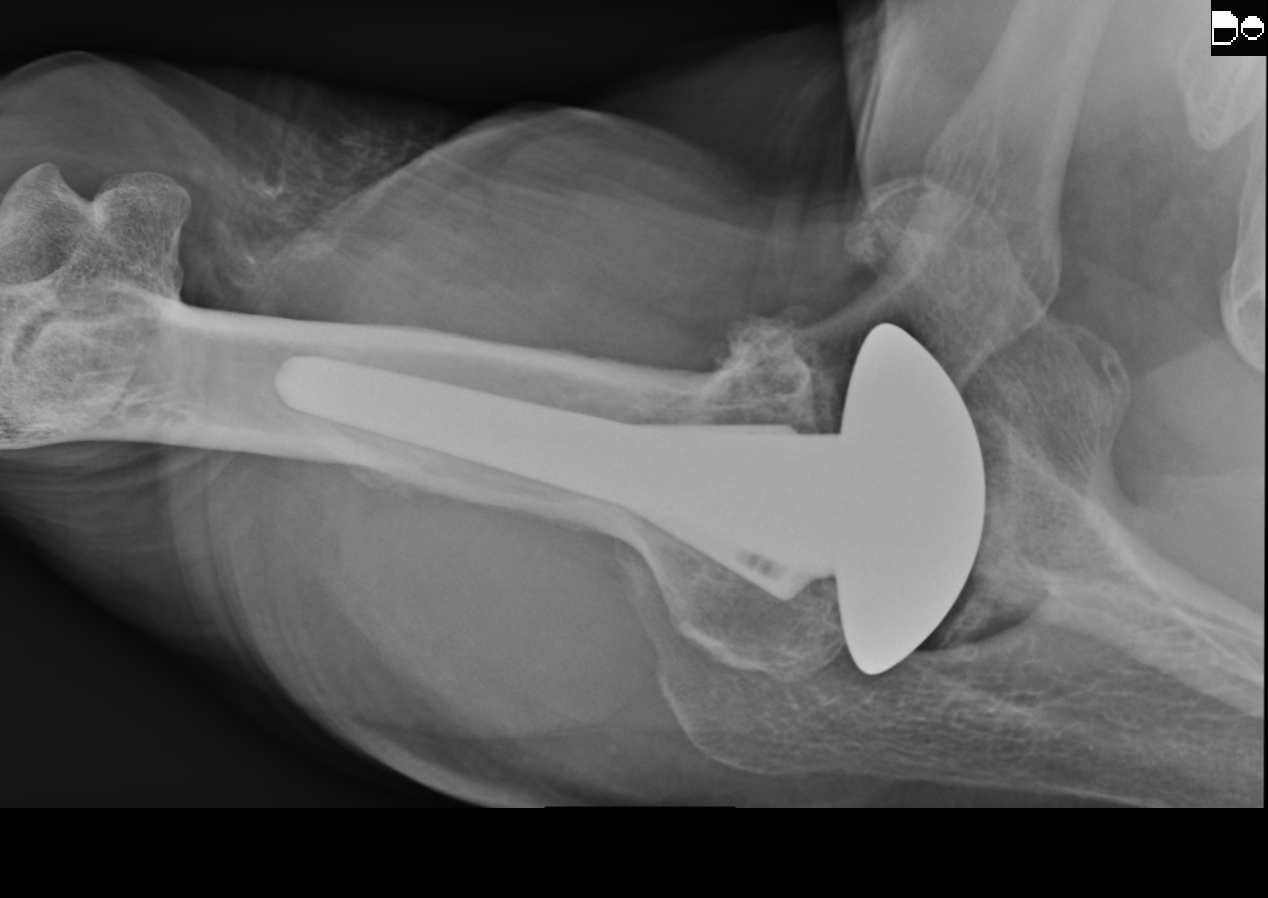
[im 3/4]
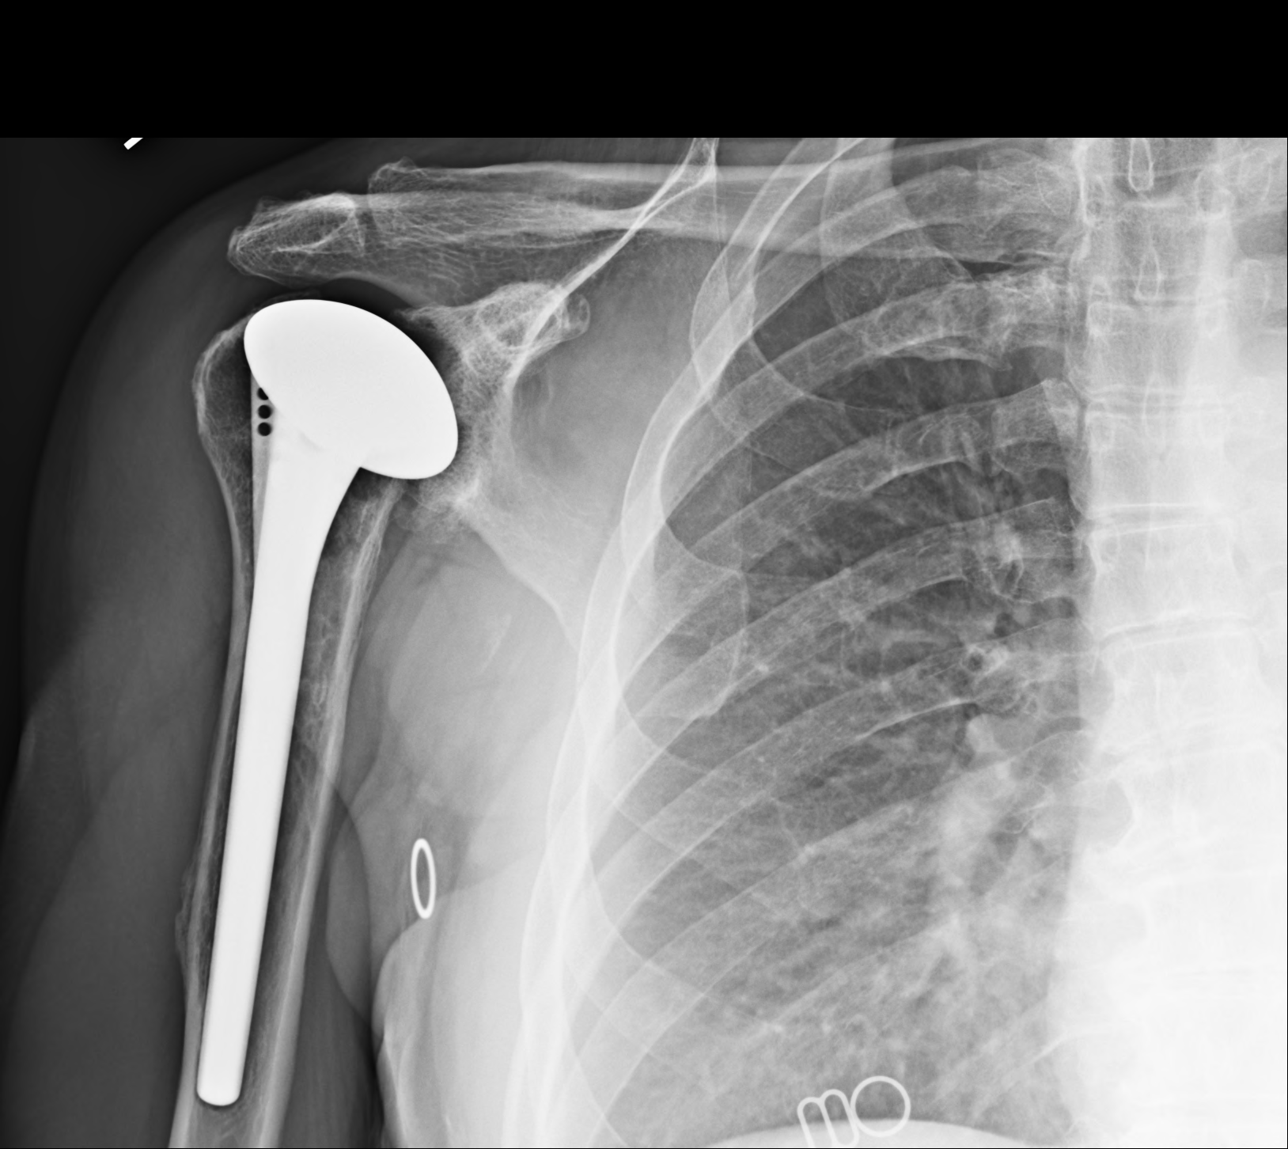
[im 4/4]
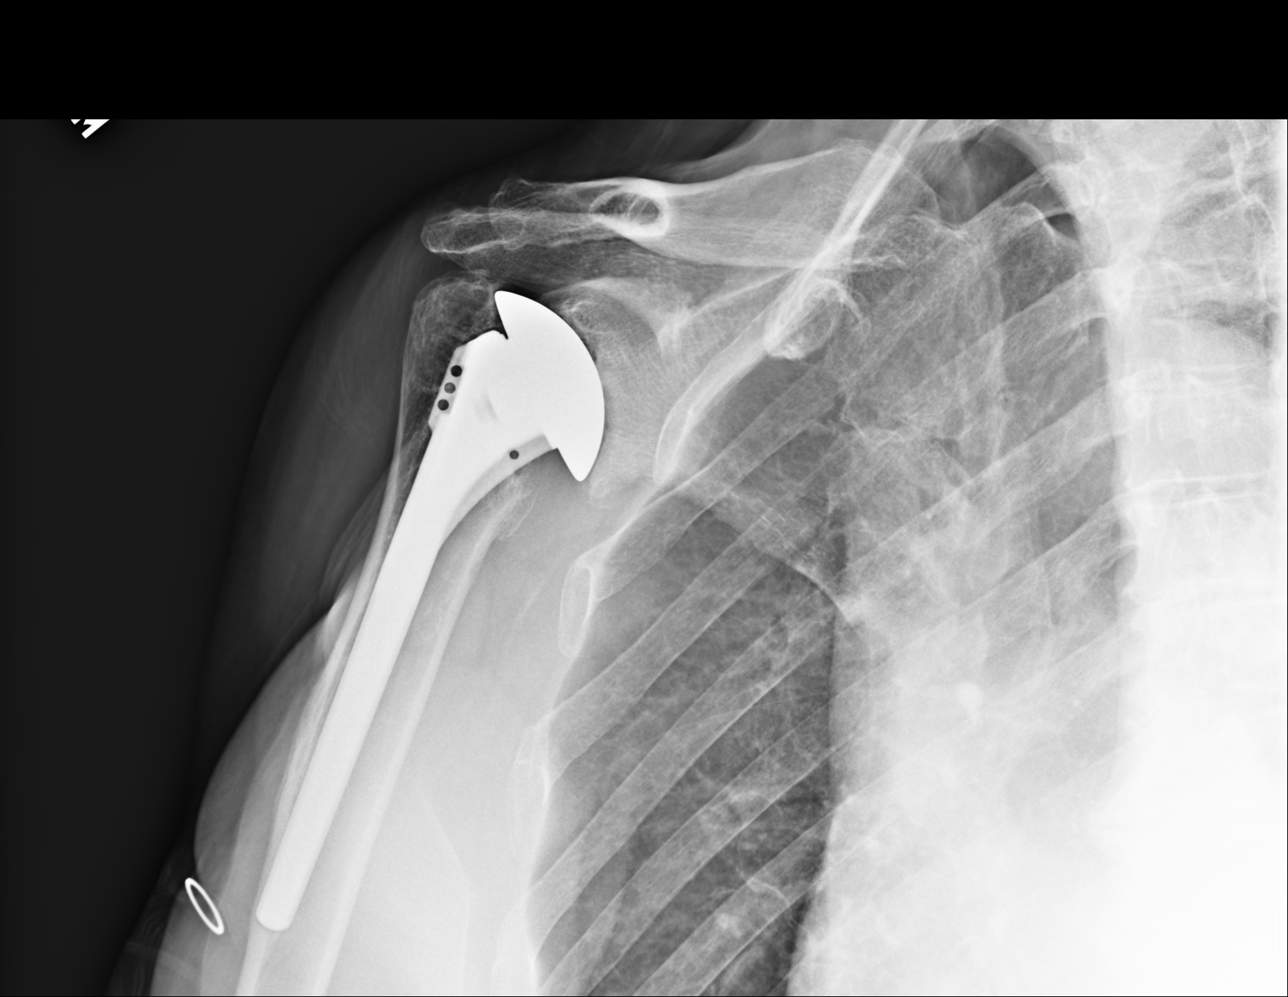

[4 of 4 positions shown; findings below may reference images not displayed]

FINDINGS: Right shoulder unipolar replacement. Severe deformity of the glenoid process although I see no acute subluxation nor hardware loosening. Mild ACJ DJD. Cervical fusion. Ectatic calcified aorta.
IMPRESSION: No acute disease.

## 2020-12-09 ENCOUNTER — Other Ambulatory Visit (HOSPITAL_COMMUNITY): Payer: Self-pay

## 2020-12-09 DIAGNOSIS — R69 Illness, unspecified: Secondary | ICD-10-CM

## 2021-08-18 IMAGING — MR MRI CSPINE WO CONTRAST
4 of 5 series · 19 of 48 positions shown · non-contrast
Comparison: Plain radiograph of cervical spine dated same day.

HISTORY: 64 year-old female with radiculopathy, cervical region.
TECHNIQUE: MRI study of the cervical spine was performed with the sagittal and axial images in varying sequences.

[Series 5: t2_tse_sag · sagittal · 3.0mm · 0.57mm/px · 6 of 15 slices shown]
[im 1/15]
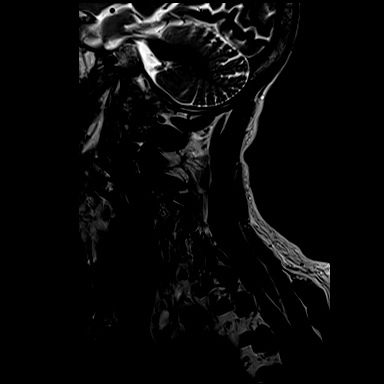
[im 3/15]
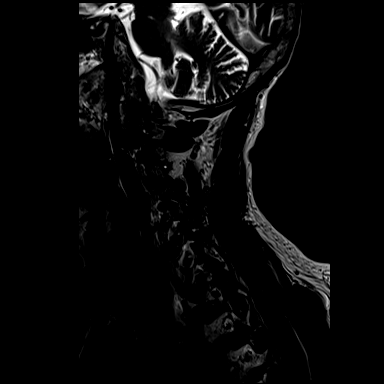
[im 6/15]
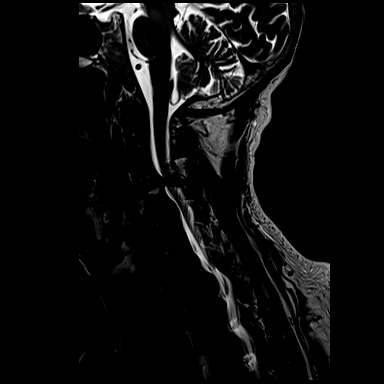
[im 9/15]
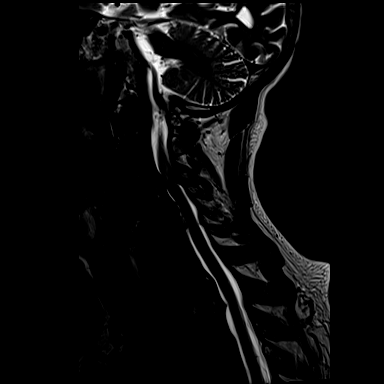
[im 12/15]
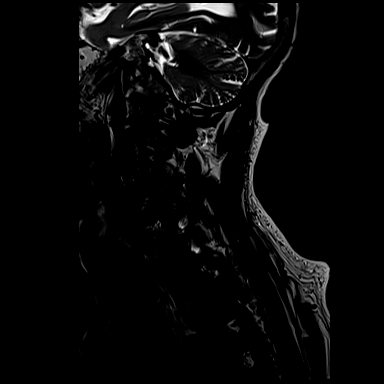
[im 15/15]
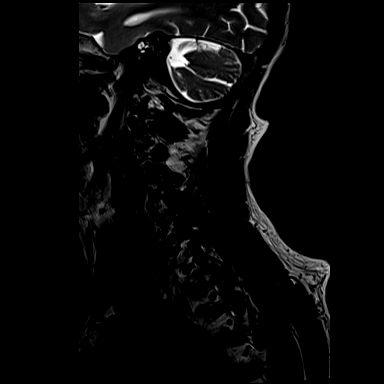

[Series 6: t1_tse_sag_p2 · sagittal · 3.0mm · 0.34mm/px · 7 of 15 slices shown]
[im 1/15]
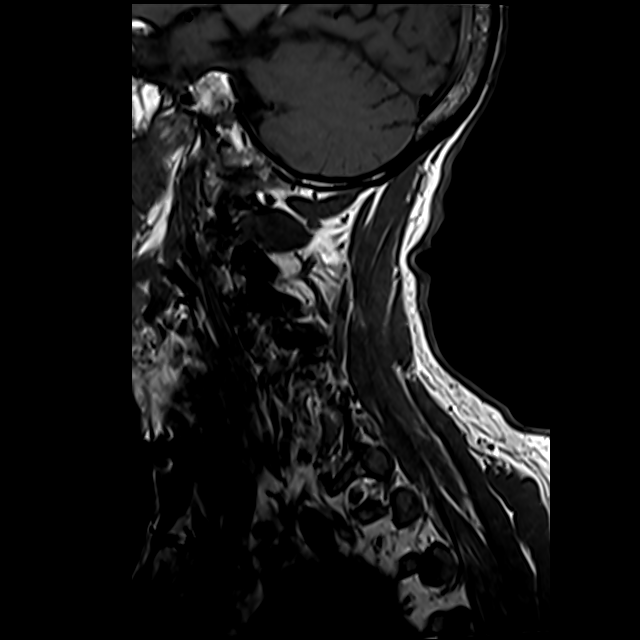
[im 3/15]
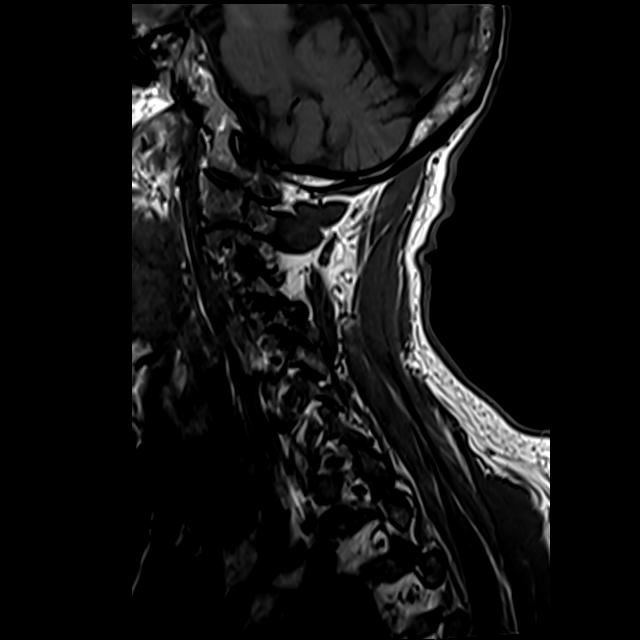
[im 5/15]
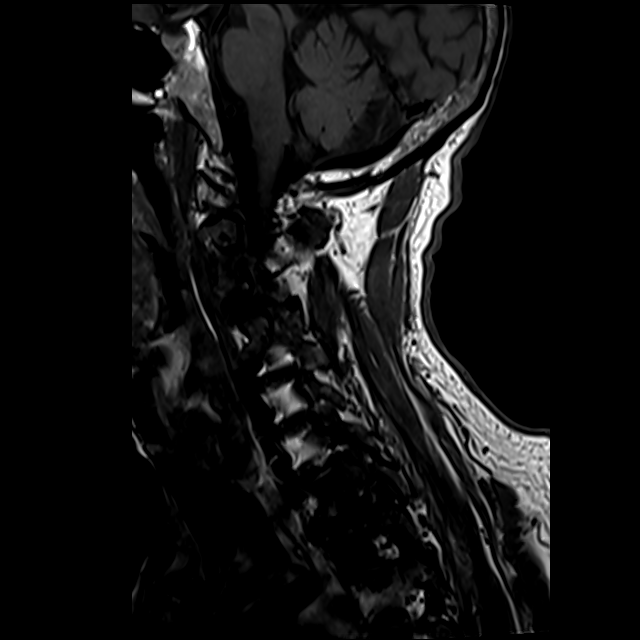
[im 8/15]
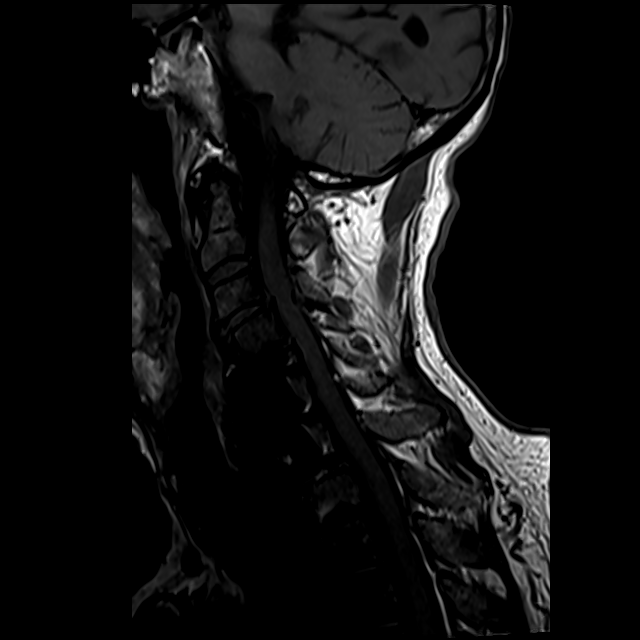
[im 10/15]
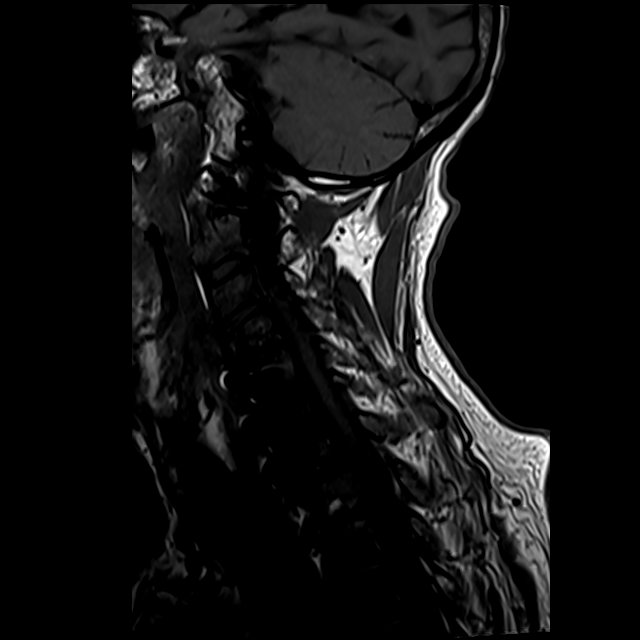
[im 12/15]
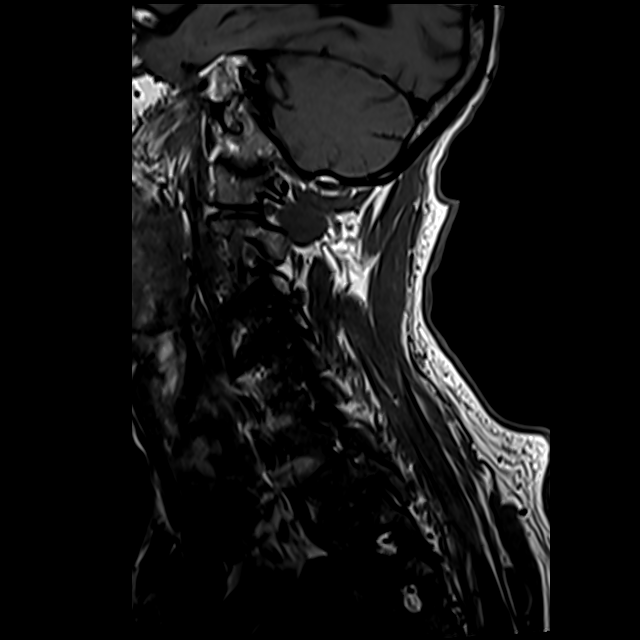
[im 15/15]
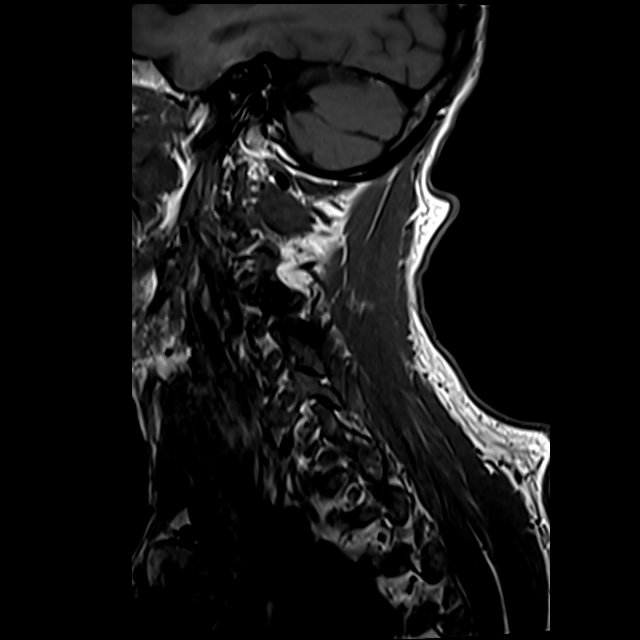

[Series 7: stir_sag_p2 · sagittal · 3.0mm · 0.34mm/px · 3 of 15 slices shown]
[im 3/15]
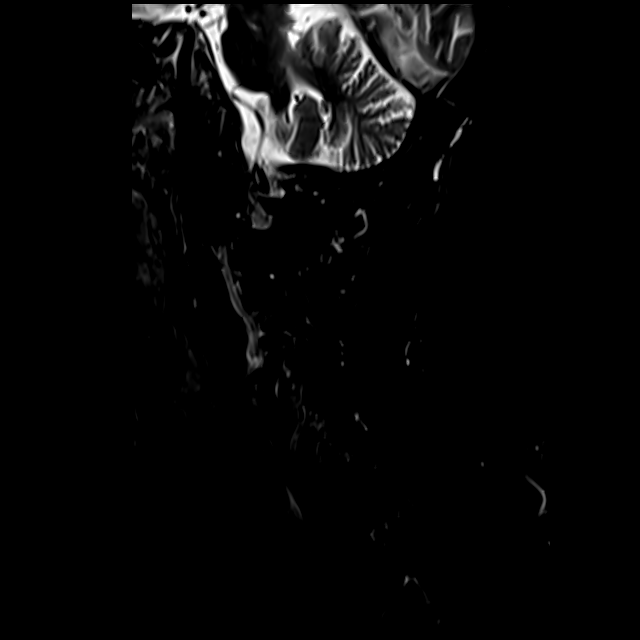
[im 8/15]
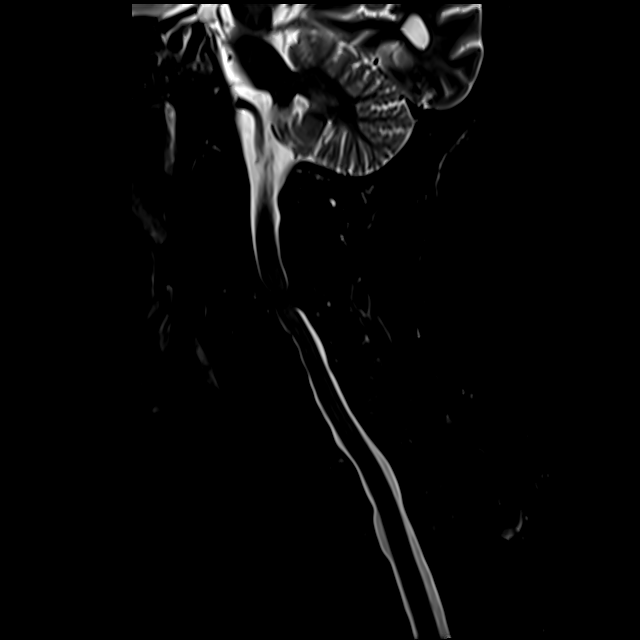
[im 12/15]
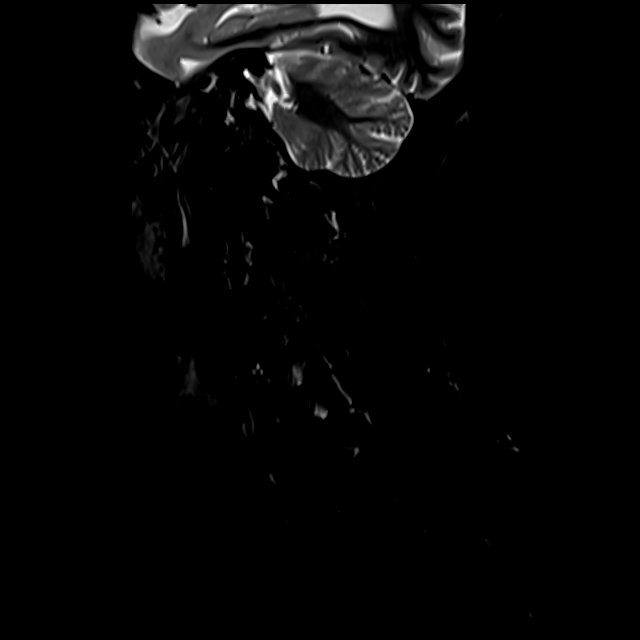

[Series 8: t2_(person_name)_(person_name)_(person_name) · axial · 3.0mm · 0.56mm/px · z∈[-106,-37]mm · 3 of 30 slices shown]
[im 5/30]
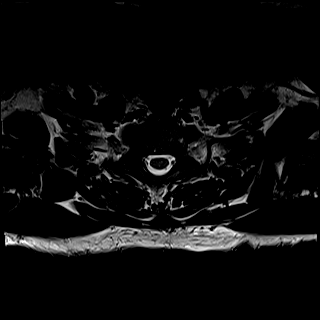
[im 16/30]
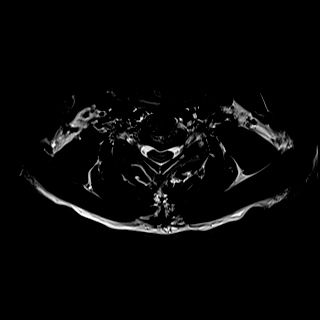
[im 25/30]
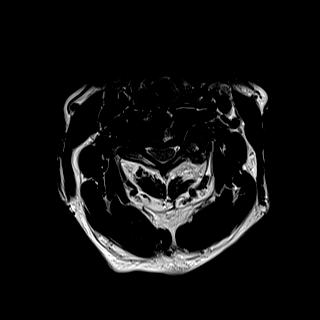

[19 of 48 positions shown; findings below may reference images not displayed]

FINDINGS: Vertebral body height and alignment: The vertebral body height and alignment are within normal limits.

The marrow signal: The marrow signal is unremarkable.

Degenerative disc changes: No significant degenerative disc changes are present.

Posterior fossa and the cervical cord: Demonstrate normal signal.

C2-3 level:  No spinal canal stenosis. Moderate to severe left neural foraminal narrowing.

C3-4 level: Slightly left eccentric moderate to large soft and hard disc complex. Moderate to large posterior bars. The combination results in moderate to severe spinal stenosis. A tiny amount of CSF is noted around the cord which is mildly distorted although still normal in signal. Severe left moderate to severe right neural foraminal narrowings. 2 mm degenerative looking subluxation of C3 over 4.

C4-5 level: Mild diffuse hard disc complex slightly effaces the thecal sac. No spinal stenosis nor foraminal narrowing

C5-6 and C6-7: Previous cervical fusion with anterior plate and screw device. No spinal stenosis nor foraminal narrowing.

C7-T1 level: No spinal canal stenosis. Neural foramina are patent.
IMPRESSION: Moderate to severe C3-4 spinal stenosis. Neural foraminal narrowings per above.

## 2023-05-22 IMAGING — MR MRI HIP RT WO CONTRAST
5 series · 31 of 40 positions shown · non-contrast
Comparison: Right hip radiographs, 05/07/2020.

Images Obtained from [facility]
INDICATION: Pain in right hip
TECHNIQUE: Multiplanar, multisequence MR imaging of the right hip was performed without contrast.

[Series 5: t2_cor_fs · coronal · right · 4.0mm · 0.82mm/px · 7 of 27 slices shown (1 of 2)]
[im 1/27]
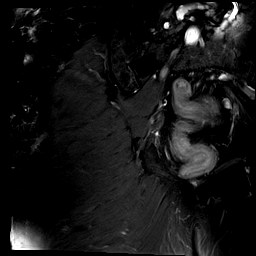
[im 5/27]
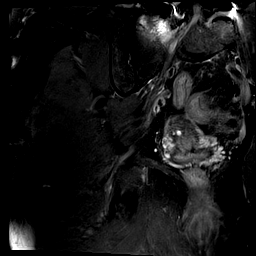
[im 9/27]
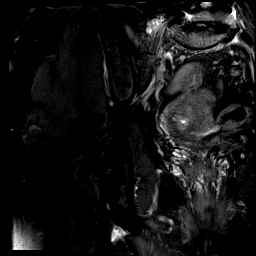
[im 14/27]
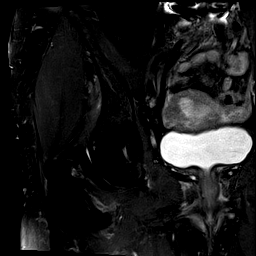
[im 18/27]
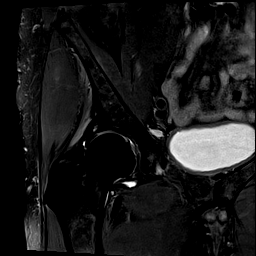
[im 22/27]
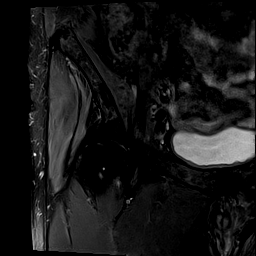
[im 27/27]
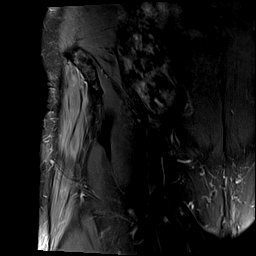

[Series 6: t2_sag_fs · sagittal · right · 4.0mm · 0.62mm/px · 6 of 22 slices shown]
[im 1/22]
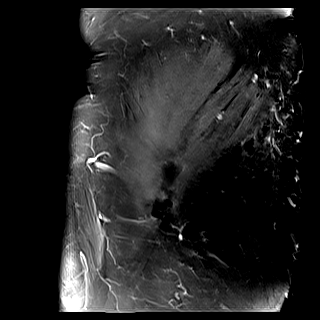
[im 5/22]
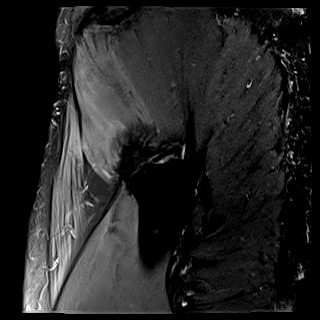
[im 9/22]
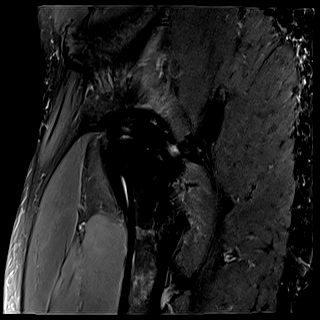
[im 13/22]
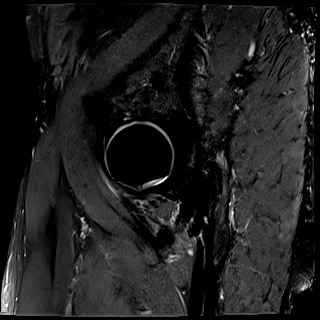
[im 17/22]
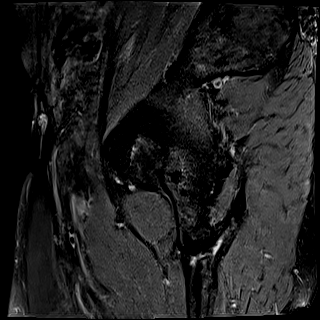
[im 22/22]
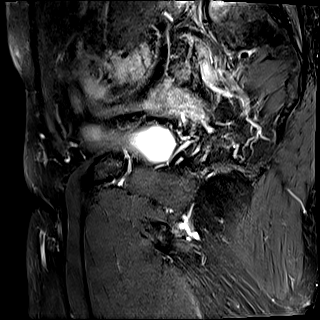

[Series 8: t2_axial_fs · axial · right · 4.0mm · 0.57mm/px · z∈[-110,+65]mm · 8 of 36 slices shown]
[im 1/36]
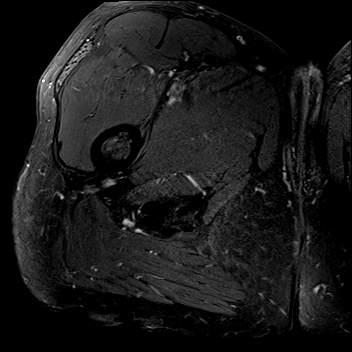
[im 4/36]
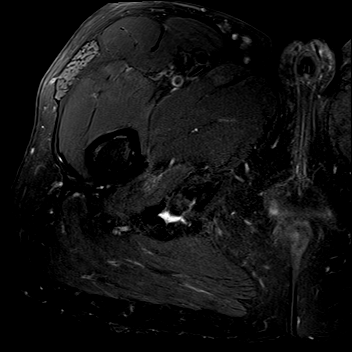
[im 12/36]
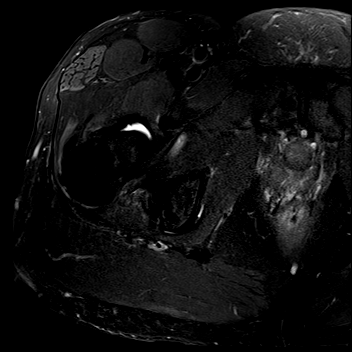
[im 16/36]
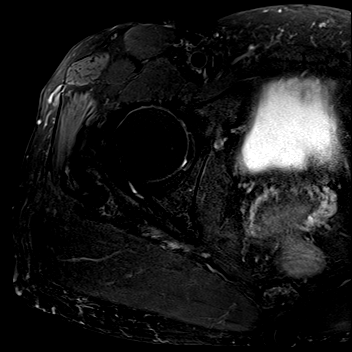
[im 20/36]
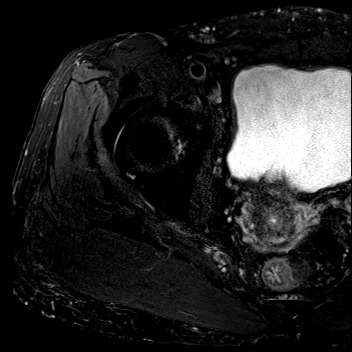
[im 24/36]
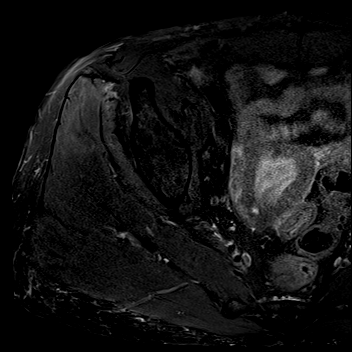
[im 32/36]
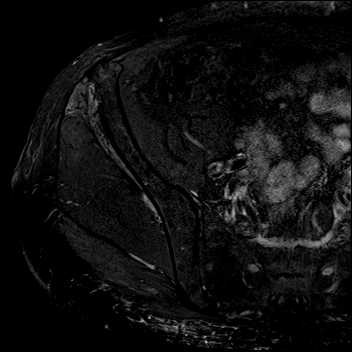
[im 36/36]
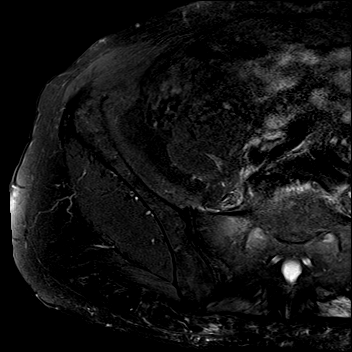

[Series 9: t2_cor_fs · coronal · right · 4.0mm · 0.60mm/px · 7 of 27 slices shown (2 of 2)]
[im 1/27]
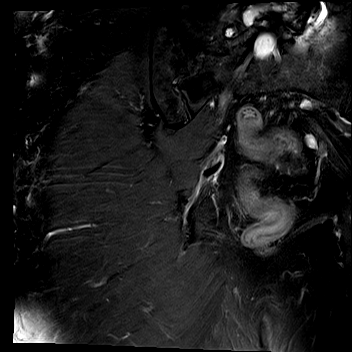
[im 5/27]
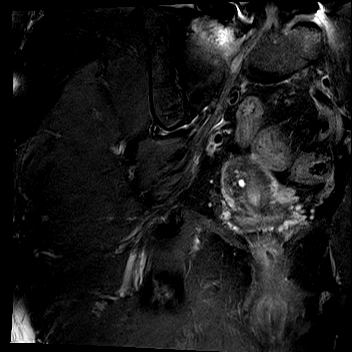
[im 9/27]
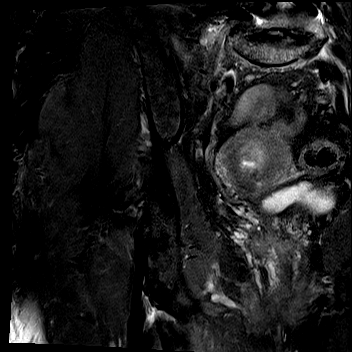
[im 14/27]
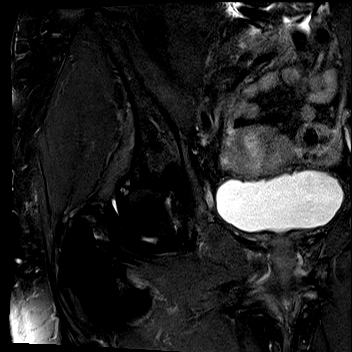
[im 18/27]
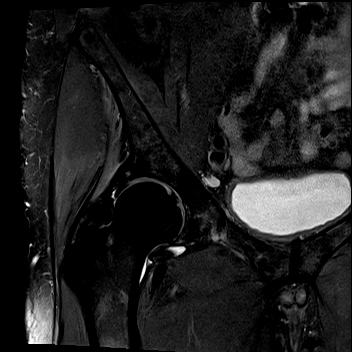
[im 22/27]
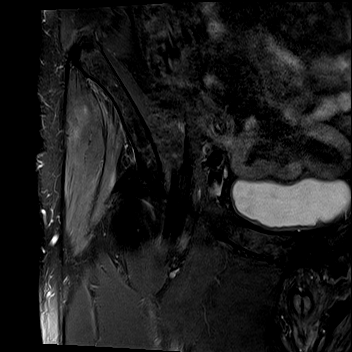
[im 27/27]
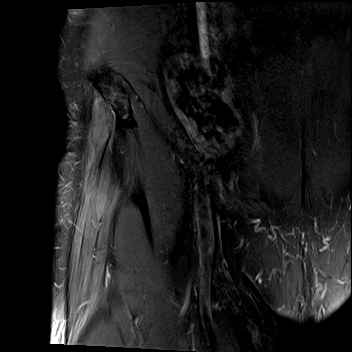

[Series 10: t1_axial · axial · right · 4.0mm · 0.28mm/px · z∈[-110,-55]mm · 3 of 36 slices shown]
[im 1/36]
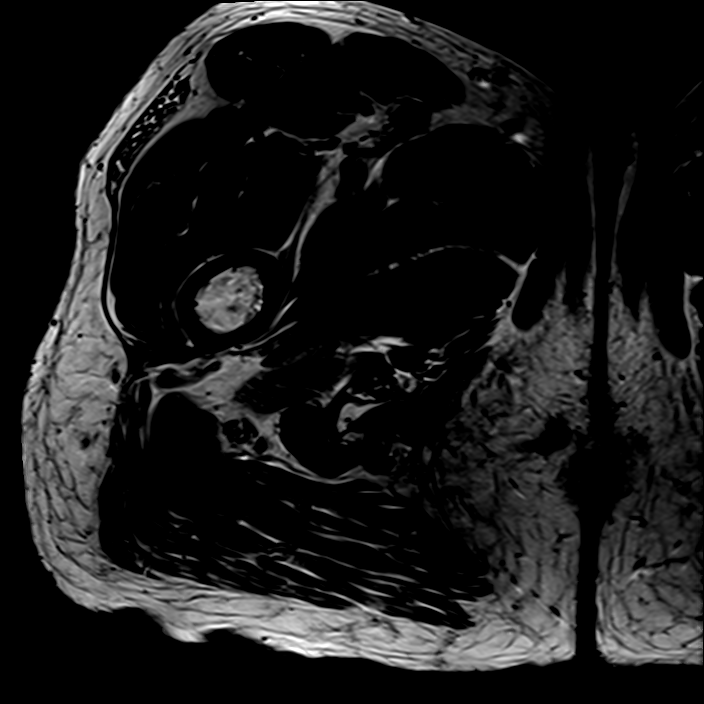
[im 4/36]
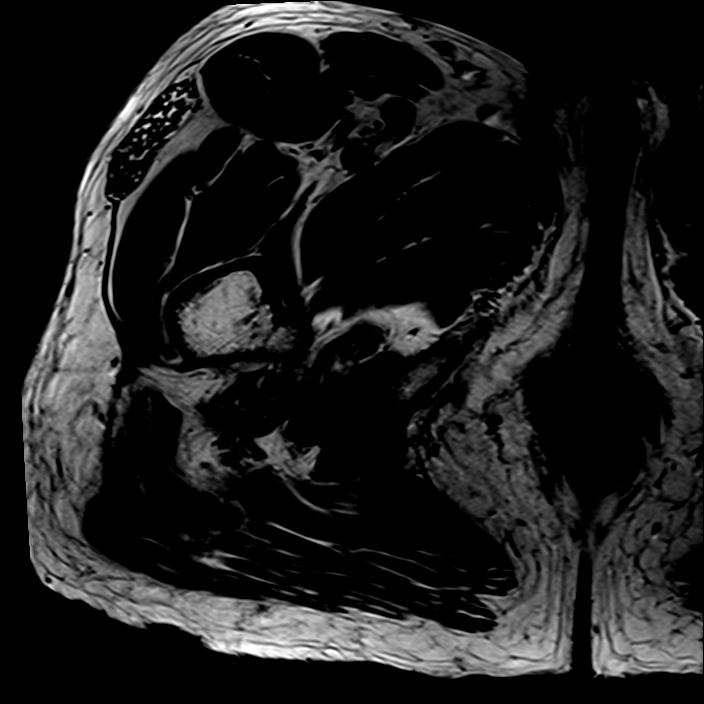
[im 12/36]
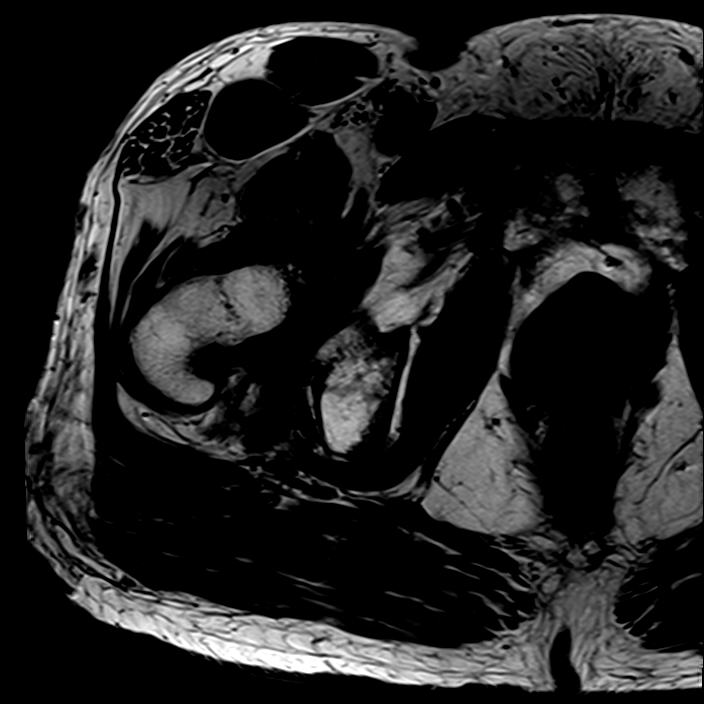

[31 of 40 positions shown; findings below may reference images not displayed]

FINDINGS: OSSEOUS: No acute fracture, avascular necrosis or aggressive osseous lesion.
HIP JOINT:
Labrum: Small tear of the anterior superior acetabular labrum.
Articular cartilage: Mild chondral thinning and irregularity of the femoroacetabular articular cartilage, with associated subchondral cystic change in the peripheral aspect of the posterior superior
acetabulum.
Effusion: Small joint effusion.
GLUTEAL TENDONS AND GREATER TROCHANTERIC BURSA:
Gluteus medius tendon: Mild tendinosis of the anterior fibers.
Gluteus minimus tendon: Intact.
Greater trochanteric bursa: Small volume fluid.
OTHER MUSCULOTENDINOUS STRUCTURES:
Rectus femoris tendon: Intact.
Iliopsoas tendon: Mild tendinosis near the tendon insertion. Small-volume fluid within the iliopsoas bursa.
Proximal hamstring tendon origin: Chronic large high-grade partial-thickness tear.
Intramuscular edema and fatty atrophy: Narrowing of the ischiofemoral space, with moderate focal edema in the quadratus femoris muscle. Mild edema within the anterior fibers of the gluteus medius
medius and throughout the gluteus minimus muscle. Additional edema like signal within the tensor fascia lata. Mild fatty atrophy of the tensor fascia lata and the gluteus minimus muscle.
SCIATIC NERVE: The visualized course and caliber of the sciatic nerve is unremarkable.
OTHER: Diverticulosis of the sigmoid colon. Remaining visualized intrapelvic contents are unremarkable.
IMPRESSION: 1.  Mild right hip osteoarthritis, with small tear of the anterior superior acetabular labrum.
2.  Mild tendinosis of the gluteus medius tendon, with mild greater trochanteric bursitis.
3.  Chronic large high-grade partial-thickness tear of the proximal hamstring tendon origin.
4.  Narrowing of the ischiofemoral space, with moderate focal edema in the quadratus femoris muscle. These findings may be seen in the setting of ischiofemoral impingement.
5.  Edema within the right hip musculature, nonspecific, but may be secondary to denervation.
